# Patient Record
Sex: Male | Born: 1955 | Race: White | Hispanic: No | Marital: Married
Health system: Midwestern US, Community
[De-identification: ages and names within clinical notes are randomized; demographics above are authoritative.]

## PROBLEM LIST (undated history)

## (undated) DIAGNOSIS — G35 Multiple sclerosis: Principal | ICD-10-CM

## (undated) DIAGNOSIS — R350 Frequency of micturition: Secondary | ICD-10-CM

## (undated) DIAGNOSIS — I10 Essential (primary) hypertension: Secondary | ICD-10-CM

## (undated) DIAGNOSIS — N402 Nodular prostate without lower urinary tract symptoms: Secondary | ICD-10-CM

---

## 2009-04-21 LAB — LIPID PANEL
Cholesterol, Total: 170
HDL: 46 mg/dl
LDL Calculated: 114 mg/dL (ref 0–160)
Triglycerides: 50 mg/dL

## 2009-04-21 LAB — PSA SCREENING: PSA: 0.48 ng/mL

## 2010-06-27 NOTE — Progress Notes (Signed)
Subjective:      Patient ID: Kenneth Wong is a 55 y.o. male.    HPI    Pt stable since last visit- pt has had 2 hernia surgeries per Dr. Neoma Laming.   Pt complains of dyspnea with exertion- no other associated symptoms- no chest pain, arm pain, neck pain, jaw, palpitations, diaphoresis.      Review of Systems   Constitutional: Negative for fever, chills, diaphoresis, fatigue and unexpected weight change.   HENT: Negative for neck pain.    Eyes: Negative for visual disturbance.   Respiratory: Positive for shortness of breath (with exertion). Negative for apnea, cough, choking, chest tightness, wheezing and stridor.    Cardiovascular: Negative for chest pain, palpitations and leg swelling.   Gastrointestinal: Negative for nausea, vomiting, abdominal pain, diarrhea, constipation, blood in stool and anal bleeding.   Genitourinary: Negative for dysuria and hematuria.   Neurological: Negative for dizziness and headaches.         BP 116/72   Pulse 60   Temp(Src) 98.1 ??F (36.7 ??C) (Oral)   Resp 14   Ht 5\' 11"  (1.803 m)   Wt 189 lb (85.73 kg)   BMI 26.36 kg/m2    Objective:   Physical Exam   Nursing note and vitals reviewed.  Constitutional: He is oriented to person, place, and time. He appears well-developed and well-nourished.   HENT:   Head: Normocephalic and atraumatic.   Right Ear: External ear normal.   Left Ear: External ear normal.   Nose: Nose normal.   Mouth/Throat: Oropharynx is clear and moist.   Eyes: Conjunctivae and EOM are normal. Pupils are equal, round, and reactive to light.   Neck: Normal range of motion. Neck supple.   Cardiovascular: Normal rate, regular rhythm and intact distal pulses.    Pulmonary/Chest: Effort normal and breath sounds normal.   Abdominal: Soft. Bowel sounds are normal. Hernia confirmed negative in the right inguinal area and confirmed negative in the left inguinal area.   Genitourinary: Rectum normal, testes normal and penis normal. Rectal exam shows no external hemorrhoid, no internal  hemorrhoid, no fissure, no mass, no tenderness and anal tone normal. Guaiac negative stool. Prostate is not enlarged and not tender. Right testis shows no mass, no swelling and no tenderness. Right testis is descended. Left testis shows no mass, no swelling and no tenderness. Left testis is descended. Circumcised. No phimosis, paraphimosis, hypospadias, penile erythema or penile tenderness. No discharge found.        Small nodule palpated at superior left portion of prostate, also prominence noted at right inferior border.  ES   Musculoskeletal: Normal range of motion.   Neurological: He is alert and oriented to person, place, and time. He has normal reflexes.   Skin: Skin is warm and dry.   Psychiatric: He has a normal mood and affect. His behavior is normal. Judgment and thought content normal.                 Immunization History   Administered Date(s) Administered   ??? Influenza Virus Vaccine 11/20/2007, 12/20/2009   ??? Td 02/20/2003     Health Maintenance   Topic Date Due   ??? Colon Cancer Screening Colonoscopy  03/18/2004   ??? Psa Counseling  04/22/2010   ??? Flu Vaccine Yearly (Adult)  10/21/2010   ??? Tetanus Vaccine Adult (11 Years And Up)  02/19/2013       Assessment:      1. Well adult exam  2. MS (multiple sclerosis)     3. Prostate nodule  Ambulatory referral to Urology   4. Special screening for malignant neoplasm of prostate  Psa screening   5. Screening, lipid  Lipid panel   6. Healthcare maintenance  Basic Metabolic Panel             Plan:      Encouraged pt to check with insurance re Pneumonia and chronic illness (MS).   Encouraged pt to discuss with Dr. Otilio Miu the Shingles shot and ability to get.    Will track down copy of most recent colonoscopy.    Check PSA, BMP, FLP with LFT's per Dr. Otilio Miu.    After discussion with pt re Shortness of Breath with exertion and no associated cardiac symptoms at this time, he would like to hold on any further testing at this point. If he has any cardiac symptoms,  will proceed to ED for further evaluation.    Refer to Dr. Harle Stanford  Follow up in 12 months.

## 2010-06-27 NOTE — Patient Instructions (Addendum)
Thank you for enrolling in MyChart. Please follow the instructions below to securely access your online medical record. MyChart allows you to send messages to your doctor, view your test results, renew your prescriptions, schedule appointments, and more.     How Do I Sign Up?  1. In your Internet browser, go to [email or web address]  2. Click on the Sign Up Now link in the Sign In box. You will see the New Member Sign Up page.  3. Enter your MyChart Access Code exactly as it appears below. You will not need to use this code after you???ve completed the sign-up process. If you do not sign up before the expiration date, you must request a new code.  MyChart Access Code: [email or web address]  Expires: 08/26/2010 11:23 AM    4. Enter your Social Security Number ([id number]) and Date of Birth (mm/dd/yyyy) as indicated and click Submit. You will be taken to the next sign-up page.  5. Create a MyChart ID. This will be your MyChart login ID and cannot be changed, so think of one that is secure and easy to remember.  6. Create a MyChart password. You can change your password at any time.  7. Enter your Password Reset Question and Answer. This can be used at a later time if you forget your password.   8. Enter your e-mail address. You will receive e-mail notification when new information is available in MyChart.  9. Click Sign Up. You can now view your medical record.     Additional Information  If you have questions, please contact your physician practice where you receive care. Remember, MyChart is NOT to be used for urgent needs. For medical emergencies, dial 911.            Encouraged pt to check with insurance re Pneumonia and chronic illness (MS).   Encouraged pt to discuss with Dr. Otilio Miu the Shingles shot and ability to get.    Will track down copy of most recent colonoscopy.    Check PSA, BMP, FLP with LFT's per Dr. Otilio Miu.    After discussion with pt re Shortness of Breath with exertion and no  associated cardiac symptoms at this time, he would like to hold on any further testing at this point. If he has any cardiac symptoms, will proceed to ED for further evaluation.    Refer to Dr. Harle Stanford  Follow up in 12 months.

## 2010-06-27 NOTE — Progress Notes (Signed)
Appt scheduled with Dr. Harle Stanford on 08/08/10 at 1:45 pm. Pt notified.

## 2010-08-08 LAB — POCT URINE DIPSTICK
Leukocytes, UA: NEGATIVE
Nitrite, UA: NEGATIVE

## 2010-08-08 MED ORDER — CIPROFLOXACIN HCL 500 MG PO TABS
500 MG | ORAL_TABLET | Freq: Two times a day (BID) | ORAL | Status: AC
Start: 2010-08-08 — End: 2010-08-11

## 2010-08-08 NOTE — Progress Notes (Signed)
Kenneth Wong was seen today for evaluation of a prostate nodule.  He is regularly seen by Kenneth Wong who performed a digital rectal exam as part of his evaluation.  He found a nodule on the prostate and felt, particularly with Kenneth Wong's history of prostate cancer in his father, that the nodule should be evaluated.  He is here today to discuss biopsy and make further follow up plans.        Past Medical History   Diagnosis Date   ??? Allergic rhinitis    ??? MS (multiple sclerosis) 2007     Kenneth Wong/ +LP, +MRI   ??? Skin cancer        Past Surgical History   Procedure Date   ??? Tonsillectomy and adenoidectomy age 59   ??? Appendectomy age 75   ??? Vasectomy    ??? Hemorrhoid surgery    ??? Hernia repair      x 2   ??? Skin cancer excision 2010     melanoma       Current Outpatient Prescriptions on File Prior to Visit   Medication Sig Dispense Refill   ??? gabapentin (NEURONTIN) 400 MG capsule Take 400 mg by mouth 2 times daily.         ??? Interferon Beta-1a (AVONEX IM) Inject  into the muscle once a week.         ??? Cholecalciferol (VITAMIN D PO) Take  by mouth daily.         ??? B Complex Vitamins (VITAMIN B COMPLEX PO) Take  by mouth daily.         ??? Loratadine (CLARITIN PO) Take  by mouth daily.         ??? aspirin 81 MG tablet Take 81 mg by mouth daily.         ??? Ascorbic Acid (VITAMIN C PO) Take  by mouth daily.         ??? Acetaminophen (TYLENOL PO) Take  by mouth as needed.         ??? Ibuprofen (ADVIL PO) Take  by mouth as needed.         ??? Naproxen Sodium (ALEVE PO) Take  by mouth as needed.             No Known Allergies    History   Substance Use Topics   ??? Smoking status: Former Smoker     Quit date: 06/26/2000   ??? Smokeless tobacco: Not on file    Comment: 1 1/2 packs a day x 30 years-quit 8 years ago   ??? Alcohol Use: Yes      6 pack/week       Family History   Problem Relation Age of Onset   ??? Parkinsonism Mother    ??? COPD Father    ??? Prostate Cancer Father    ??? Prostate Cancer Maternal Uncle        Review of Systems  No problems  with ears, nose or throat.  No problems with eyes.  No chest pain, shortness of breath, abdominal pain, extremity pain or weakness, and no neurological deficits.  No rashes.  No swollen glands or lymph nodes.  GU symptoms per HPI.  The remainder of the review of symptoms is negative.    Physical Exam  Nursing note and vitals reviewed.  Constitutional: Vital signs are normal. he appears well-developed and well-nourished. he is cooperative.   HENT:   Head: Normocephalic and atraumatic.   Mouth/Throat: Mucous membranes are  normal.   Eyes: EOM are normal. No scleral icterus.   Neck: Trachea normal. Neck supple. No tracheal deviation present.   Cardiovascular: Normal rate, regular rhythm, normal heart sounds and normal pulses.    Pulses:       Radial pulses are 2+ on the right side, and 2+ on the left side.   Pulmonary/Chest: Effort normal and breath sounds normal. No respiratory distress.   Abdominal: Soft. No tenderness. he has no rebound, no guarding and no CVA tenderness.   Musculoskeletal: Normal range of motion. he exhibits no edema and no tenderness.  Lymphadenopathy:     he has no cervical adenopathy.        Right: No supraclavicular adenopathy present.        Left: No supraclavicular adenopathy present.   Neurological: he is alert. No cranial nerve deficit.   Skin: Skin is warm and dry.   Psychiatric: he has a normal mood and affect.     Labs  Urine dip demonstrates trace blood.    Assessment and Plan  Kenneth Wong presents for evaluation of a prostate nodule.  We discussed DRE and its roll in the screening of prostate cancer, along with its limitations for the same.  We discussed prostate cancer and briefly touched on possible treatment options, including active surveillance, should a cancer be found.      We discussed prostate biopsy.  I described the procedure in detail and also described the associated risks and benefits at length.  Kenneth Wong understands these risks and benefits and desires to proceed.  He will be  scheduled for the procedure in the very near future.     I will see Kenneth Wong back in the office in the next couple weeks for his biopsy. He will stop all asprin and blood thinners and will start a 3 day course of Cipro the day before the procedure.  I will see him back a week or two after the procedure to discuss the results.  We will make further recommendations based on these results.

## 2010-08-09 NOTE — Patient Instructions (Signed)
Benign Prostatic Hyperplasia   (BPH; Benign Prostatic Hypertrophy; Prostatism; Bladder Outlet Obstruction)     Definition   Benign prostatic hyperplasia (BPH) is an enlargement of the prostate. The prostate is usually a walnut-sized gland located at the neck of the bladder. It surrounds the urethra. The gland is part of the male reproductive system. The enlargement is not due to cancer.     Enlarged Prostate        2011 [employer]   Causes   The exact cause of BPH is unknown. It may be related to changes in hormone levels as men age. Eventually, the prostate becomes so enlarged that it puts pressure on the urethra. This causes the urethra to narrow or close completely.   Risk Factors   A risk factor is something that increases your chance of getting a disease or condition. The main risk factor for BPH is being over 50 years old.   Symptoms   Narrowing of the urethra caused by growth of the prostate causes the symptoms of BPH. Symptoms usually increase in severity over time.   Symptoms include:   ?? Difficulty starting to urinate   ?? Weak urination stream   ?? Dribbling at end of urination   ?? Sensation of incomplete bladder emptying   ?? Urge to urinate frequently, especially at night   ?? Deep discomfort in lower abdomen   ?? Urge incontinence   Diagnosis   BPH diagnosis is based on:   ?? Your age   ?? Symptoms   ?? Digital rectal examthe doctor inserts a gloved finger into the rectum to examine the area   Other tests may include:   ?? Urine flow study   ?? Cystometrogram (a functional study of the way your bladder fills and empties)   ?? X-ray of the urinary tract   ?? Transrectal ultrasound   ?? Post-void residual volume testmeasures whether you can empty your bladder completely   ?? Cystoscopy this test allows a doctor to look inside the urethra and bladder   Testing for prostate specific antigen (PSA) is often used to screen for prostate cancer. However, BPH may cause a lesser elevation in PSA levels . This  can raise false concerns about the presence of cancer.   Treatment   Treatment is not needed for mild cases. Most men with BPH eventually request medical intervention.   Treatments include:   Medication   Medicines include:   ?? Enzyme ( 5-alpha reductase) inhibitors   ?? Finasteride   ?? Dutasteride (Avodart)   ?? Alpha-blockers (eg, tamsulosin , alfuzosin , doxazosin , terazosin ))reduce bladder obstruction and improve urine flow by relaxing the muscles of the prostate and bladder neck   ?? Antimuscarinics ( oxybutynin , solifenacin , tolterodine , darifenacin , trospium , fesoterodine )relax the bladder muscles   Each group of medicines have different side effects. Enzyme inhibitors may cause decreased sexual desire and problems with erection. Alpha-blockers may cause decreased blood pressure, dizziness, and stuffy nose. Antimuscarinics can cause dry mouth, constipation, dry eyes, trouble emptying the bladder, and confusion. Doctors may prescribe a combination of drugs from each group, which may work better to prevent worsening of disease.   Men with BPH should not take decongestant drugs containing alpha-agonists, such as pseudoephedrine . These drugs can worsen the symptoms of BPH.   Minimally Invasive Interventions   These are used when drugs are ineffective, but the patient is not ready for surgery. Nonsurgical treatments include:   ??   Transurethral microwave thermotherapy (TUMT)uses microwaves to destroy excess prostate tissue   ?? Transurethral needle ablation (TUNA)uses low levels of radio frequency energy to burn away portions of the enlarged prostate   ?? Transurethral laser therapyuses highly focused laser energy to remove prostate tissue   Surgery   Surgical procedures include:   ?? Transurethral surgical resection of the prostate (TURP) a scope is inserted through the penis to remove the enlarged portion of the prostate   ?? Transurethral incision of the prostate (TUIP)small cuts are made in the neck of the  bladder to widen the urethra   ?? Open surgeryremoval of the enlarged portion of the prostate through an incision, usually in the lower abdominal area, much more invasive then TURP or TUIP   ?? Prostatic stentstiny metal coils that are inserted into urethra to widen it and keep it open   ?? Usually used for men who do not wish to take medication or have surgery   ?? Do not appear to be a good long-term option   Alternative Treatments   Three different herbal products are used for BPH:   ?? Saw palmetto   ?? Beta-sitosterol   ?? Pygeum ( Prunus Africana )   Research regarding its effectiveness and safety is limited. The results are often conflicting. This may be due to variability of the products and difficulty in conducting these types of studies.   If you are diagnosed with BPH, follow your doctor's instructions .   Prevention   Prostate enlargement occurs naturally with age. There are no specific prevention steps.     Last Reviewed: September 2010 Adrienne Carmack, MD   Updated: 11/08/2008

## 2010-08-12 LAB — LIPID PANEL
Cholesterol, Total: 150
HDL: 53 mg/dL (ref 35–70)
LDL Calculated: 89 mg/dL (ref 0–160)
Triglycerides: 39 mg/dL

## 2010-08-16 NOTE — Telephone Encounter (Signed)
Spoke to pt and notified him of his lab results.

## 2010-08-16 NOTE — Telephone Encounter (Signed)
See scanned copy of labs.  Per ES - call patient BMP/FLP ok.  Left message to call back.

## 2010-08-24 NOTE — Progress Notes (Signed)
Mr. Brandstetter was seen in follow up for his prostate biopsy.  This was done without difficulty or complication.    TRUS prostate biopsy note:  After obtaining informed consent, the rectal ultrasound probe was passed per rectum and the prostate visualized.  A local block was performed by instilling 2% lidocaine at the base Measurements were taken and the prostate measured 3.48 x 4.44 x 4.65 mm for a total volume of 37.6 cc.  At this point, prostate biopsy was performed.  Two cores were taken at the base, the mid-portion, and the apex of the gland on either side for a total of 12 cores.  The rectal probe was removed and there was minimal bleeding.  Mr. Muff tolerated the procedure well and there were no complications.    Prostate size: 37.6 cc  Prostatic calcifications:no   Hypoechoic areas: no  Cores taken:12  Complications: none    Plan:  I will see Caeson back to discuss the pathology in 1-2 weeks.  Further recommendations will be based on these results.

## 2010-08-24 NOTE — Progress Notes (Signed)
Procedure: Trus/Biopsy  Pt name and birth date verified Yes  Patient agrees Dr. Is taking biopsies of the prostate Yes  Patient took Enema 2 hours prior to procedure Yes  Patient took 2 pills of Cipro day before procedure, day of, and will the day after Yes  Patient stopped all blood thinners prior to surgery Yes

## 2010-09-06 NOTE — Progress Notes (Signed)
Subjective:      Patient ID: Kenneth Wong is a 55 y.o. male.  Chief Complaint   Patient presents with   ??? Follow-up     bx results     Prostate nodule:    Other  This is a chronic problem. The current episode started more than 1 month ago. The problem occurs constantly. The problem has been unchanged. Associated symptoms include joint swelling and a rash. Pertinent negatives include no chest pain, congestion, nausea, sore throat or vomiting. The symptoms are aggravated by nothing. He has tried nothing for the symptoms. Improvement on treatment: biopsy performed, small amount of HG PIN.     Underwent biopsy.  Here to discuss results.    Past Medical History   Diagnosis Date   ??? Allergic rhinitis    ??? MS (multiple sclerosis) 2007     Dr. Almudallal/ +LP, +MRI   ??? Skin cancer        History     Social History   ??? Marital Status: Married     Spouse Name: Arline Asp     Number of Children: 2   ??? Years of Education: N/A     Occupational History   ???  [employer]     Social History Main Topics   ??? Smoking status: Former Smoker     Quit date: 06/26/2000   ??? Smokeless tobacco: Not on file    Comment: 1 1/2 packs a day x 30 years-quit 8 years ago   ??? Alcohol Use: Yes      6 pack/week   ??? Drug Use: No   ??? Sexually Active: Not on file     Other Topics Concern   ??? Not on file     Social History Narrative   ??? No narrative on file       Family History   Problem Relation Age of Onset   ??? Parkinsonism Mother    ??? COPD Father    ??? Prostate Cancer Father    ??? Prostate Cancer Maternal Uncle        Past Surgical History   Procedure Date   ??? Tonsillectomy and adenoidectomy age 78   ??? Appendectomy age 81   ??? Vasectomy    ??? Hemorrhoid surgery    ??? Hernia repair      x 2   ??? Skin cancer excision 2010     melanoma   ??? Prostate biopsy 08/2010     dr Harle Stanford       No Known Allergies    Current outpatient prescriptions:gabapentin (NEURONTIN) 400 MG capsule, Take 400 mg by mouth 2 times daily.  , Disp: , Rfl: ;  Interferon Beta-1a (AVONEX IM),  Inject  into the muscle once a week.  , Disp: , Rfl: ;  Cholecalciferol (VITAMIN D PO), Take  by mouth daily.  , Disp: , Rfl: ;  B Complex Vitamins (VITAMIN B COMPLEX PO), Take  by mouth daily.  , Disp: , Rfl: ;  Loratadine (CLARITIN PO), Take  by mouth daily.  , Disp: , Rfl:   aspirin 81 MG tablet, Take 81 mg by mouth daily.  , Disp: , Rfl: ;  Ascorbic Acid (VITAMIN C PO), Take  by mouth daily.  , Disp: , Rfl: ;  Acetaminophen (TYLENOL PO), Take  by mouth as needed.  , Disp: , Rfl: ;  Ibuprofen (ADVIL PO), Take  by mouth as needed.  , Disp: , Rfl: ;  Naproxen Sodium (  ALEVE PO), Take  by mouth as needed.  , Disp: , Rfl:         Review of Systems   Constitutional: Negative for activity change and appetite change.   HENT: Negative for congestion, sore throat, facial swelling and sneezing.    Eyes: Negative for pain and itching.   Respiratory: Negative for apnea, chest tightness and shortness of breath.    Cardiovascular: Negative for chest pain and leg swelling.   Gastrointestinal: Negative for nausea, vomiting, diarrhea, constipation and rectal pain.   Genitourinary: Negative for dysuria, urgency, frequency, hematuria, flank pain, decreased urine volume, discharge, penile swelling, scrotal swelling, enuresis, difficulty urinating, genital sores, penile pain and testicular pain.   Musculoskeletal: Positive for joint swelling. Negative for back pain.   Skin: Positive for rash. Negative for wound.   Neurological: Negative for dizziness and light-headedness.   Hematological: Negative for adenopathy. Does not bruise/bleed easily.   Psychiatric/Behavioral: Negative for hallucinations and confusion.     BP 124/76   Ht 5' 10.5" (1.791 m)   Wt 185 lb (83.915 kg)   BMI 26.17 kg/m2    Objective:   Physical Exam   Constitutional: He is oriented to person, place, and time. He appears well-developed and well-nourished.   HENT:   Head: Normocephalic and atraumatic.   Eyes: EOM are normal.   Pulmonary/Chest: Effort normal and breath  sounds normal. No respiratory distress.   Abdominal: Soft. He exhibits no distension. There is no tenderness.   Neurological: He is alert and oriented to person, place, and time.   Skin: Skin is warm and dry.   Psychiatric: He has a normal mood and affect.     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  Otherwise all cores were negative.  PSA: 0.48       Assessment:      Negative biopsy.  Small amount of high grade PIN.      Plan:      Will check PSA in 6 months.  If remains very low and stable will continue to watch.  If PSA rising will plan to check PCA-3 test.

## 2010-10-02 NOTE — Patient Instructions (Signed)
1.  Continue avonex 30 mcg IM weekly. With calcium and vitamin D daily.   2. Need to do CBC, hepatic panel.    3. Follow up in 6 months or sooner if needed.   4. Call if any questions or concerns.    Ma Hillock. Giulian Goldring MD

## 2010-10-02 NOTE — Progress Notes (Signed)
NEUROLOGY OUT PATIENT FOLLOW UP NOTE:  8/13/20122:32 PM    Derek Mound is here for follow up for   Patient Active Problem List   Diagnoses   . Allergic rhinitis   . MS (multiple sclerosis)   . Prostate nodule    Doing well no new events. On avonex, calcium and vitamin D daily, no new complaints. Tolerating avonex well. No numbness, no balance trouble, no weakness. Overall happy with control of symptoms.     ROS:  Respiratory : no cough, no hemoptysis.  Cardiac: no chest pain. No palpitations.  Renal : no flank pain, no hematuria   Reviewed labs since last evaluation and discussed with patient.     Prior to Admission medications    Medication Sig Start Date End Date Taking? Authorizing Provider   gabapentin (NEURONTIN) 400 MG capsule Take 400 mg by mouth 2 times daily.     Yes Historical Provider, MD   Interferon Beta-1a (AVONEX IM) Inject  into the muscle once a week.     Yes Historical Provider, MD   Cholecalciferol (VITAMIN D PO) Take  by mouth daily.     Yes Historical Provider, MD   B Complex Vitamins (VITAMIN B COMPLEX PO) Take  by mouth daily.     Yes Historical Provider, MD   Loratadine (CLARITIN PO) Take  by mouth daily.     Yes Historical Provider, MD   aspirin 81 MG tablet Take 81 mg by mouth daily.     Yes Historical Provider, MD   Ascorbic Acid (VITAMIN C PO) Take  by mouth daily.     Yes Historical Provider, MD   Acetaminophen (TYLENOL PO) Take  by mouth as needed.     Yes Historical Provider, MD   Ibuprofen (ADVIL PO) Take  by mouth as needed.     Yes Historical Provider, MD   Naproxen Sodium (ALEVE PO) Take  by mouth as needed.     Yes Historical Provider, MD     No Known Allergies  Current outpatient prescriptions:gabapentin (NEURONTIN) 400 MG capsule, Take 400 mg by mouth 2 times daily.  , Disp: , Rfl: ;  Interferon Beta-1a (AVONEX IM), Inject  into the muscle once a week.  , Disp: , Rfl: ;  Cholecalciferol (VITAMIN D PO), Take  by mouth daily.  , Disp: , Rfl: ;  B Complex Vitamins (VITAMIN B  COMPLEX PO), Take  by mouth daily.  , Disp: , Rfl: ;  Loratadine (CLARITIN PO), Take  by mouth daily.  , Disp: , Rfl:   aspirin 81 MG tablet, Take 81 mg by mouth daily.  , Disp: , Rfl: ;  Ascorbic Acid (VITAMIN C PO), Take  by mouth daily.  , Disp: , Rfl: ;  Acetaminophen (TYLENOL PO), Take  by mouth as needed.  , Disp: , Rfl: ;  Ibuprofen (ADVIL PO), Take  by mouth as needed.  , Disp: , Rfl: ;  Naproxen Sodium (ALEVE PO), Take  by mouth as needed.  , Disp: , Rfl:       PE:   Filed Vitals:    10/02/10 1412   BP: 136/81   Pulse: 67   Height: 5' 10.5" (1.791 m)   Weight: 185 lb (83.915 kg)     General Appearance:  awake, alert, oriented, in no acute distress, well developed, well nourished, in no acute distress and cooperative  Gen: AO3, NAD, Language is Intact.  Head: PERRL, EOMI, no icterus  Neck: There is  no carotid bruits. The Neck is supple.  Neuro: CN 2-12 grossly intact with no focal deficits. Power 5/5 Throughout symmetric, Reflexes are +2 symmetric. Long tracts are intact. Cerebellar exam is Intact. Sensory exam is intact to light touch.  Gait is intact.  Osteo: Lumbar curvatures with no scoliosis and good ROM        DATA:    Did not do blood work, forgot to do.     Assessment:    1. Multiple sclerosis, relapsing-remitting    stable, doing well, no new events. On avonex weekly.     Plan:  1.  Continue avonex 30 mcg IM weekly.  2. Need to do CBC, hepatic panel.    3. Follow up in 6 months or sooner if needed.   4. Patient is instructed to call if any questions or concerns.      Please call if any questions.     Ma Hillock. Cordero Surette MD

## 2011-01-03 MED ORDER — INTERFERON BETA-1A 30 MCG/0.5ML IM KIT
30 MCG/0.5ML | PACK | INTRAMUSCULAR | Status: DC
Start: 2011-01-03 — End: 2011-10-01

## 2011-01-03 MED ORDER — GABAPENTIN 400 MG PO CAPS
400 MG | ORAL_CAPSULE | Freq: Two times a day (BID) | ORAL | Status: DC
Start: 2011-01-03 — End: 2011-10-01

## 2011-04-02 NOTE — Progress Notes (Signed)
NEUROLOGY OUT PATIENT FOLLOW UP NOTE:  2/11/20134:14 PM    Kenneth Wong is here for follow up for   Patient Active Problem List   Diagnosis   . Allergic rhinitis   . MS (multiple sclerosis)   . Prostate nodule    Kenneth Wong  is here for follow up of multiple sclerosis relapsing. Comes in by himself. He reports no new events. he is on Avonex 30 mcg IM weekly. No new reported vision change. No new balance issues. No urinary or bowel symptoms. No numbness. The patient is also on vitamin D, calcium, primrose oil. Blood work last done was 6 months ago.    ROS:  Respiratory : no cough, no hemoptysis.  Cardiac: no chest pain. No palpitations.  Renal : no flank pain, no hematuria    Reviewed labs since last evaluation and discussed with patient.     Prior to Admission medications    Medication Sig Start Date End Date Taking? Authorizing Provider   interferon beta-1a (AVONEX) 30 MCG/0.5ML injection Inject 0.5 mLs into the muscle once a week. 01/03/11  Yes Cain Saupe, MD   gabapentin (NEURONTIN) 400 MG capsule Take 1 capsule by mouth 2 times daily. 01/03/11  Yes Cain Saupe, MD   Cholecalciferol (VITAMIN D PO) Take  by mouth daily.     Yes Historical Provider, MD   B Complex Vitamins (VITAMIN B COMPLEX PO) Take  by mouth daily.     Yes Historical Provider, MD   Loratadine (CLARITIN PO) Take  by mouth daily.     Yes Historical Provider, MD   aspirin 81 MG tablet Take 81 mg by mouth daily.     Yes Historical Provider, MD   Ascorbic Acid (VITAMIN C PO) Take  by mouth daily.     Yes Historical Provider, MD   Naproxen Sodium (ALEVE PO) Take  by mouth as needed.     Yes Historical Provider, MD     No Known Allergies  Current outpatient prescriptions:interferon beta-1a (AVONEX) 30 MCG/0.5ML injection, Inject 0.5 mLs into the muscle once a week., Disp: 12 kit, Rfl: 3;  gabapentin (NEURONTIN) 400 MG capsule, Take 1 capsule by mouth 2 times daily., Disp: 180 capsule, Rfl: 3;  Cholecalciferol (VITAMIN D PO), Take  by  mouth daily.  , Disp: , Rfl: ;  B Complex Vitamins (VITAMIN B COMPLEX PO), Take  by mouth daily.  , Disp: , Rfl:   Loratadine (CLARITIN PO), Take  by mouth daily.  , Disp: , Rfl: ;  aspirin 81 MG tablet, Take 81 mg by mouth daily.  , Disp: , Rfl: ;  Ascorbic Acid (VITAMIN C PO), Take  by mouth daily.  , Disp: , Rfl: ;  Naproxen Sodium (ALEVE PO), Take  by mouth as needed.  , Disp: , Rfl:       PE:   Filed Vitals:    04/02/11 1602   BP: 137/86   Pulse: 77   Height: 5\' 10"  (1.778 m)   Weight: 190 lb (86.183 kg)     General Appearance:  awake, alert, oriented, in no acute distress, well developed, well nourished and cooperative  Gen: AO3, NAD, Language is Intact.  Head: PERRL, EOMI, no icterus  Neck: There is no carotid bruits. The Neck is supple.  Neuro: CN 2-12 grossly intact with no focal deficits. Power 5/5 Throughout symmetric, Reflexes are symmetric. Long tracts are intact. Cerebellar exam is Intact. Sensory exam is intact to light touch.  Gait is intact.  Osteo: Lumbar curvatures with no scoliosis and good ROM        DATA:  Reviewed.     Assessment:    1. Multiple sclerosis, relapsing-remitting     He is doing, no reported events. After detailed discussion with patient we agreed on the following plan.      Plan:  1.  Continue current medication.   2.  Follow up in 6 months or sooner if needed.   3. CBC, hepatic panel.   4. Continue calcium and vitamin D daily with primrose oil.   5. Call if any questions or concerns.      Please call if any questions.     Ma Hillock. Serai Tukes MD

## 2011-04-02 NOTE — Patient Instructions (Signed)
1.  Continue current medications.   2.  Follow up in 6 months or sooner if needed.   3. CBC, hepatic panel.   4. Continue calcium and vitamin D daily with primrose oil.   5. Call if any questions or concerns.

## 2011-04-17 LAB — POCT URINE DIPSTICK
Blood, UA POC: NEGATIVE
Leukocytes, UA: NEGATIVE
Nitrite, UA: NEGATIVE

## 2011-04-17 LAB — POST VOID RESIDUAL (PVR): post void residual: 113 ml

## 2011-04-17 NOTE — Progress Notes (Signed)
Subjective:      Patient ID: Kenneth Wong is a 56 y.o. male.    Chief Complaint   Patient presents with   ??? Elevated PSA     f/u 6 mo       Other  This is a chronic (prostate nodule) problem. The current episode started more than 1 month ago. The problem occurs constantly. The problem has been unchanged. Pertinent negatives include no abdominal pain, chest pain, chills, fever, joint swelling, nausea, rash or vomiting. Nothing aggravates the symptoms. 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 The treatment provided moderate relief.       Past Medical History   Diagnosis Date   ??? Allergic rhinitis    ??? MS (multiple sclerosis) 2007     Dr. Almudallal/ +LP, +MRI   ??? Skin cancer        History     Social History   ??? Marital Status: Married     Spouse Name: Arline Asp     Number of Children: 2   ??? Years of Education: N/A     Occupational History   ???  [employer]     Social History Main Topics   ??? Smoking status: Former Smoker     Quit date: 06/26/2000   ??? Smokeless tobacco: Not on file    Comment: 1 1/2 packs a day x 30 years-quit 8 years ago   ??? Alcohol Use: Yes      6 pack/week   ??? Drug Use: No   ??? Sexually Active: Not on file     Other Topics Concern   ??? Not on file     Social History Narrative   ??? No narrative on file       Family History   Problem Relation Age of Onset   ??? Parkinsonism Mother    ??? COPD Father    ??? Prostate Cancer Father    ??? Prostate Cancer Maternal Uncle        Past Surgical History   Procedure Laterality Date   ??? Tonsillectomy and adenoidectomy  age 67   ??? Appendectomy  age 30   ??? Vasectomy     ??? Hemorrhoid surgery     ??? Hernia repair       x 2   ??? Skin cancer excision  2010     melanoma   ??? Prostate biopsy  08/2010     dr Harle Stanford       No Known Allergies    Current outpatient prescriptions:interferon beta-1a (AVONEX) 30 MCG/0.5ML injection, Inject 0.5 mLs into the muscle once a week., Disp: 12 kit, Rfl: 3;  gabapentin (NEURONTIN) 400 MG capsule, Take 1 capsule  by mouth 2 times daily., Disp: 180 capsule, Rfl: 3;  Cholecalciferol (VITAMIN D PO), Take  by mouth daily.  , Disp: , Rfl: ;  B Complex Vitamins (VITAMIN B COMPLEX PO), Take  by mouth daily.  , Disp: , Rfl:   Loratadine (CLARITIN PO), Take  by mouth daily.  , Disp: , Rfl: ;  aspirin 81 MG tablet, Take 81 mg by mouth daily.  , Disp: , Rfl: ;  Ascorbic Acid (VITAMIN C PO), Take  by mouth daily.  , Disp: , Rfl: ;  Naproxen Sodium (ALEVE PO), Take  by mouth as needed.  , Disp: , Rfl:     Review of Systems   Constitutional: Negative for fever, chills and unexpected weight change.   Eyes: Negative  for pain and visual disturbance.   Respiratory: Negative for shortness of breath and wheezing.    Cardiovascular: Negative for chest pain and palpitations.   Gastrointestinal: Negative for nausea, vomiting and abdominal pain.   Endocrine: Negative for cold intolerance and heat intolerance.   Genitourinary: Positive for frequency. Negative for dysuria, urgency, hematuria and difficulty urinating.   Musculoskeletal: Negative for back pain and joint swelling.   Skin: Negative for rash and wound.   Allergic/Immunologic: Negative for environmental allergies and food allergies.   Neurological: Negative for dizziness, tremors and seizures.   Hematological: Does not bruise/bleed easily.   Psychiatric/Behavioral: Negative for confusion and disturbed wake/sleep cycle.   BP 128/66   Ht 5' 11.5" (1.816 m)   Wt 201 lb 4.8 oz (91.309 kg)   BMI 27.69 kg/m2    Objective:   Physical Exam   Constitutional: He is oriented to person, place, and time. He appears well-developed and well-nourished. No distress.   HENT:   Head: Normocephalic and atraumatic.   Mouth/Throat: No oropharyngeal exudate.   Eyes: EOM are normal. Pupils are equal, round, and reactive to light. Right eye exhibits no discharge. Left eye exhibits no discharge. No scleral icterus.   Neck: No JVD present. No tracheal deviation present.   Cardiovascular: Normal rate and normal heart  sounds.    Pulmonary/Chest: Effort normal. No respiratory distress. He has no wheezes.   Abdominal: Soft. He exhibits no distension. There is no tenderness.   Musculoskeletal: He exhibits no edema and no tenderness.   Neurological: He is alert and oriented to person, place, and time.   Skin: Skin is warm and dry. He is not diaphoretic.   Psychiatric: He has a normal mood and affect. His behavior is normal.     Labs    Results for POC orders placed in visit on 04/17/11   POST VOID RESIDUAL (PVR)       Result Value Range    post void residual 113     POCT URINE DIPSTICK       Result Value Range    Color, UA        Clarity, UA        Glucose, UA POC        Bilirubin, UA        Ketones, UA        Spec Grav, UA        Blood, UA POC neg      pH, UA        Protein, UA POC        Urobilinogen, UA        Leukocytes, UA neg      Nitrite, UA neg      Appearance, Fluid    Clear, Slightly Cloudy     PSA: 0.7    Assessment:      PSA remains under 1.  Having some moderate LUTS and today has a PVR of 113.  This will need to be watched along with the PSA.         Plan:      Will see back in 6 months with PSA.  If PSA climbs above 1 we will plan to perform a DRE at next visit and send a PCA-3 test.  We will also check a flow rate and a PVR at that visit.    Follow up in 6 months.

## 2011-09-25 NOTE — Telephone Encounter (Signed)
Left message.

## 2011-09-25 NOTE — Telephone Encounter (Signed)
Message copied by Carlena Bjornstad on Tue Sep 25, 2011 10:29 AM  ------       Message from: Valentina Shaggy, ALI S       Created: Tue Sep 25, 2011  9:42 AM         Please let patient Know Test/Lab result is normal.  ------

## 2011-10-01 MED ORDER — GABAPENTIN 400 MG PO CAPS
400 MG | ORAL_CAPSULE | Freq: Two times a day (BID) | ORAL | Status: DC
Start: 2011-10-01 — End: 2012-01-11

## 2011-10-01 MED ORDER — INTERFERON BETA-1A 30 MCG/0.5ML IM KIT
30 MCG/0.5ML | PACK | INTRAMUSCULAR | Status: DC
Start: 2011-10-01 — End: 2012-01-11

## 2011-10-01 NOTE — Patient Instructions (Signed)
1.  Continue current medication.   2.  Follow up in 6 months or sooner if needed.   3. CBC, hepatic panel prior to next visit.   4. Continue calcium and vitamin D daily with primrose oil.   5. Call if any questions or concerns.

## 2011-10-01 NOTE — Progress Notes (Signed)
NEUROLOGY OUT PATIENT FOLLOW UP NOTE:  8/12/20134:29 PM    Kenneth Wong is here for follow up for   Patient Active Problem List   Diagnosis   ??? Allergic rhinitis   ??? MS (multiple sclerosis)   ??? Prostate nodule    Kenneth Wong  is here for follow up of multiple sclerosis relapsing. Comes in by himself. He reports no new events. He is on Avonex 30 mcg IM weekly. No new reported vision change. No new balance issues. No urinary or bowel symptoms. No numbness. The patient is also on vitamin D, calcium, primrose oil. Blood work last done was one week ago.    ROS:  Respiratory : no cough, no hemoptysis.  Cardiac: no chest pain. No palpitations.  Renal : no flank pain, no hematuria    Reviewed labs since last evaluation and discussed with patient.     No Known Allergies  Current outpatient prescriptions:Coenzyme Q10 (COQ10 PO), Take  by mouth daily., Disp: , Rfl: ;  VITAMIN E, Take  by mouth daily., Disp: , Rfl: ;  interferon beta-1a (AVONEX) 30 MCG/0.5ML injection, Inject 0.5 mLs into the muscle once a week., Disp: 12 kit, Rfl: 3;  gabapentin (NEURONTIN) 400 MG capsule, Take 1 capsule by mouth 2 times daily., Disp: 180 capsule, Rfl: 3;  Cholecalciferol (VITAMIN D PO), Take  by mouth daily.  , Disp: , Rfl:   B Complex Vitamins (VITAMIN B COMPLEX PO), Take  by mouth daily.  , Disp: , Rfl: ;  Loratadine (CLARITIN PO), Take  by mouth daily.  , Disp: , Rfl: ;  aspirin 81 MG tablet, Take 81 mg by mouth daily.  , Disp: , Rfl: ;  Ascorbic Acid (VITAMIN C PO), Take  by mouth daily.  , Disp: , Rfl: ;  Naproxen Sodium (ALEVE PO), Take  by mouth as needed.  , Disp: , Rfl:       PE:   Filed Vitals:    10/01/11 1620   BP: 126/77   Pulse: 69   Height: 5\' 10"  (1.778 m)   Weight: 185 lb (83.915 kg)     General Appearance:  awake, alert, oriented, in no acute distress, well developed, well nourished and cooperative  Gen: AO3, NAD, Language is Intact.  Head: PERRL, EOMI, no icterus  Neck: There is no carotid bruits. The Neck is  supple.  Neuro: CN 2-12 grossly intact with no focal deficits. Power 5/5 Throughout symmetric, Reflexes are symmetric. Long tracts are intact. Cerebellar exam is Intact. Sensory exam is intact to light touch.  Gait is intact.  Osteo: Lumbar curvatures with no scoliosis and good ROM        DATA:  Cbc and hepatic panel were normal.     Assessment:    1. Multiple sclerosis, relapsing-remitting     He is doing, no reported events. After detailed discussion with patient we agreed on the following plan.      Plan:  1.  Continue current medication.   2.  Follow up in 6 months or sooner if needed.   3. CBC, hepatic panel prior to next visit.   4. Continue calcium and vitamin D daily with primrose oil.   5. Call if any questions or concerns.      Please call if any questions.     Ma Hillock. Victorino Fatzinger MD

## 2011-10-16 LAB — POCT URINE DIPSTICK
Blood, UA POC: NEGATIVE
Leukocytes, UA: NEGATIVE
Nitrite, UA: NEGATIVE

## 2011-10-16 LAB — POST VOID RESIDUAL (PVR): post void residual: 0 ml

## 2011-10-16 MED ORDER — TAMSULOSIN HCL 0.4 MG PO CAPS
0.4 MG | ORAL_CAPSULE | Freq: Every evening | ORAL | Status: DC
Start: 2011-10-16 — End: 2011-10-16

## 2011-10-16 MED ORDER — TAMSULOSIN HCL 0.4 MG PO CAPS
0.4 MG | ORAL_CAPSULE | Freq: Every evening | ORAL | Status: DC
Start: 2011-10-16 — End: 2012-04-17

## 2011-10-16 NOTE — Progress Notes (Signed)
Subjective:      Patient ID: Kenneth Kenneth Wong, 56 y.o., male, Mar 13, 1955    Chief Complaint   Patient presents with   ??? Follow-up     urinary frequency/prostate nodule     .    Benign Prostatic Hypertrophy  This is a chronic problem. The current episode started more than 1 year ago. The problem has been gradually worsening since onset. Irritative symptoms include frequency (intermittent) and nocturia. Obstructive symptoms include an intermittent stream, a slower stream and a weak stream. Nothing aggravates the symptoms. Past treatments include nothing. The treatment provided no relief.     Kenneth Kenneth Wong presents today in follow up for a prostate nodule. He also has some lower urinary tract symptoms and is here for evaluation and treatment of BPH.    Kenneth Kenneth Wong has a prostate nodule was found in the last year. He's had this evaluated with biopsy and we then closely following his PSA. His biopsy was negative for cancer and his PSA has consistently been below one. His previous PSA was 0.48 and his most recent PSA is 0.6. His lower urinary tract symptoms have been worsening over time and is something else would like to have evaluated.      Past Medical History   Diagnosis Date   ??? Allergic rhinitis    ??? MS (multiple sclerosis) 2007     Kenneth Kenneth Wong/ +LP, +MRI   ??? Skin cancer        History     Social History   ??? Marital Status: Married     Spouse Name: Kenneth Kenneth Wong     Number of Children: 2   ??? Years of Education: N/A     Occupational History   ???  Kenneth Kenneth Wong     Social History Main Topics   ??? Smoking status: Former Smoker     Quit date: 06/26/2000   ??? Smokeless tobacco: Never Used    Comment: 1 1/2 packs a day x 30 years-quit 8 years ago   ??? Alcohol Use: Yes      6 pack/week   ??? Drug Use: No   ??? Sexually Active: Not on file     Other Topics Concern   ??? Not on file     Social History Narrative   ??? No narrative on file       Family History   Problem Relation Age of Onset   ??? Parkinsonism Mother    ??? COPD Father    ??? Prostate Cancer Father     ??? Prostate Cancer Maternal Uncle        Past Surgical History   Procedure Laterality Date   ??? Tonsillectomy and adenoidectomy  age 51   ??? Appendectomy  age 64   ??? Vasectomy     ??? Hemorrhoid surgery     ??? Hernia repair       x 2   ??? Skin cancer excision  2010     melanoma   ??? Prostate biopsy  08/2010     Kenneth Kenneth Wong       No Known Allergies    Current outpatient prescriptions:Coenzyme Q10 (COQ10 PO), Take  by mouth daily., Disp: , Rfl: ;  VITAMIN E, Take  by mouth daily., Disp: , Rfl: ;  interferon beta-1a (AVONEX) 30 MCG/0.5ML injection, Inject 0.5 mLs into the muscle once a week., Disp: 12 kit, Rfl: 3;  gabapentin (NEURONTIN) 400 MG capsule, Take 1 capsule by mouth 2 times daily., Disp: 180 capsule, Rfl:  3;  Cholecalciferol (VITAMIN D PO), Take  by mouth daily.  , Disp: , Rfl:   B Complex Vitamins (VITAMIN B COMPLEX PO), Take  by mouth daily.  , Disp: , Rfl: ;  Loratadine (CLARITIN PO), Take  by mouth daily.  , Disp: , Rfl: ;  aspirin 81 MG tablet, Take 81 mg by mouth daily.  , Disp: , Rfl: ;  Ascorbic Acid (VITAMIN C PO), Take  by mouth daily.  , Disp: , Rfl: ;  Naproxen Sodium (ALEVE PO), Take  by mouth as needed.  , Disp: , Rfl:       Review of Systems   Constitutional: Negative for fever.   Eyes: Negative for redness.   Respiratory: Negative for shortness of breath.    Cardiovascular: Negative for chest pain.   Gastrointestinal: Negative for diarrhea and constipation.   Genitourinary: Positive for frequency (intermittent), difficulty urinating (hesitancy) and nocturia.   Musculoskeletal: Negative for back pain.   Skin: Negative for rash.   Neurological: Negative for dizziness and headaches.   Hematological: Does not bruise/bleed easily.   Psychiatric/Behavioral: Negative for agitation.   All other systems reviewed and are negative.        BP 124/68   Ht 5\' 10"  (1.778 m)   Wt 185 lb (83.915 kg)   BMI 26.54 kg/m2      Objective:   Physical Exam   Nursing note and vitals reviewed.  Constitutional: He is oriented  to person, place, and time. Kenneth Wong signs are normal. He appears well-developed and well-nourished. He is cooperative. No distress.   HENT:   Head: Normocephalic and atraumatic.   Mouth/Throat: Oropharynx is clear and moist and mucous membranes are normal. No oropharyngeal exudate.   Eyes: EOM are normal. Pupils are equal, round, and reactive to light. Right eye exhibits no discharge. Left eye exhibits no discharge. No scleral icterus.   Neck: Trachea normal. No JVD present. No tracheal deviation present.   Cardiovascular: Normal rate, regular rhythm and normal pulses.    Pulses:       Radial pulses are 2+ on the right side, and 2+ on the left side.   Pulmonary/Chest: Effort normal and breath sounds normal. No respiratory distress. He has no wheezes.   Abdominal: Soft. He exhibits no distension. There is no tenderness. There is no rebound and no CVA tenderness.   Genitourinary: Rectal exam shows anal tone normal. Prostate is not tender.   Musculoskeletal: He exhibits no edema and no tenderness.   Lymphadenopathy:        Right: No supraclavicular adenopathy present.        Left: No supraclavicular adenopathy present.   Neurological: He is alert and oriented to person, place, and time. No cranial nerve deficit.   Skin: Skin is warm and dry. He is not diaphoretic.   Psychiatric: He has a normal mood and affect. His behavior is normal.     Labs    Results for POC orders placed in visit on 10/16/11   POCT URINE DIPSTICK       Result Value Range    Color, UA        Clarity, UA        Glucose, UA POC        Bilirubin, UA        Ketones, UA        Spec Grav, UA        Blood, UA POC neg      pH, UA  Protein, UA POC        Urobilinogen, UA        Leukocytes, UA neg      Nitrite, UA neg      Appearance, Fluid    Clear, Slightly Cloudy   POST VOID RESIDUAL (PVR)       Result Value Range    post void residual 0         No results found for this basename: CREATININE, BUN, NA, K, CL, CO2       Lab Results   Component Value  Date    PSA 0.48 04/21/2009     Most recent PSA: 0.6    Uroflow study carried out today with spontaneous voiding shows the following:  Average flow rate,  6.0 ml/s  Max flow,  11.1 ml/s  Voided volume,348.8  Voiding Intervals, 2  PVR on bladder scan 0 ml.  Conclusion (Siroky nomogram) obstructive      Assessment:      Traevion presents today in follow up for his prostate nodule and some moderate lower urinary tract symptoms. He reports that he is having increasing symptoms as of late. He is waking up more night and he is noticing increasing urgency, frequency, and incomplete emptying. Today on his uroflow study he did demonstrate slow flow and a pattern consistent with obstruction. Although he did not have a PVR he reports that his urination today during this testing was as good as it ever is usually is even worse.    Dearis had a recent PSA rechecked. This remains under one at 0.6. We will need to keep an eye on this over time as he did have a nodule. Despite that, with her previous biopsy, I believe our risk of cancer at this point is quite low.      Plan:      I will plan to start Taksh on Flomax. Do believe this will help some with his obstructive symptoms. It should also improve his uroflow study. I believe at this point we are low risk for prostate cancer in keeping an eye on Rennie's PSA is a reasonable strategy at this point. If the PSA continues to climb and, in particular, if it climbs quickly we may need to consider another biopsy.    I will start on Flomax today and plan to see him back in 6 months with another flow rate in PVR on arrival. He will call if he has any trouble or concerns with the Flomax.

## 2012-01-11 ENCOUNTER — Encounter

## 2012-01-11 MED ORDER — GABAPENTIN 400 MG PO CAPS
400 MG | ORAL_CAPSULE | Freq: Two times a day (BID) | ORAL | Status: DC
Start: 2012-01-11 — End: 2012-10-06

## 2012-01-11 MED ORDER — INTERFERON BETA-1A 30 MCG/0.5ML IM KIT
30 MCG/0.5ML | PACK | INTRAMUSCULAR | Status: DC
Start: 2012-01-11 — End: 2012-10-06

## 2012-04-03 NOTE — Progress Notes (Signed)
NEUROLOGY OUT PATIENT FOLLOW UP NOTE:  2/13/20144:08 PM    Kenneth Wong is here for follow up for   Patient Active Problem List   Diagnosis   ??? Allergic rhinitis   ??? Multiple sclerosis, relapsing-remitting   ??? Prostate nodule    [name]  is here for follow up of multiple sclerosis relapsing. Comes in by himself. He is doing well. Reports no new events. He is on Avonex 30 mcg IM weekly. No vision change. No urinary or bowel symptoms. No numbness. He is also on vitamin D, calcium, primrose oil.       No Known Allergies  Current outpatient prescriptions:gabapentin (NEURONTIN) 400 MG capsule, Take 1 capsule by mouth 2 times daily., Disp: 180 capsule, Rfl: 3;  interferon beta-1a (AVONEX) 30 MCG/0.5ML injection, Inject 0.5 mLs into the muscle once a week., Disp: 12 kit, Rfl: 3;  tamsulosin (FLOMAX) 0.4 MG capsule, Take 1 capsule by mouth nightly., Disp: 90 capsule, Rfl: 3;  Coenzyme Q10 (COQ10 PO), Take  by mouth daily., Disp: , Rfl:   VITAMIN E, Take  by mouth daily., Disp: , Rfl: ;  Cholecalciferol (VITAMIN D PO), Take  by mouth daily.  , Disp: , Rfl: ;  B Complex Vitamins (VITAMIN B COMPLEX PO), Take  by mouth daily.  , Disp: , Rfl: ;  Loratadine (CLARITIN PO), Take  by mouth daily.  , Disp: , Rfl: ;  aspirin 81 MG tablet, Take 81 mg by mouth daily.  , Disp: , Rfl: ;  Ascorbic Acid (VITAMIN C PO), Take  by mouth daily.  , Disp: , Rfl:   Naproxen Sodium (ALEVE PO), Take  by mouth as needed.  , Disp: , Rfl:       PE:   Filed Vitals:    04/03/12 1554   BP: 155/78   Pulse: 78   Height: 5\' 10"  (1.778 m)   Weight: 190 lb (86.183 kg)     General Appearance:  awake, alert, oriented, in no distress, well developed, well nourished and cooperative  Gen: AO3, NAD, Language is Intact.  Head: PERRL, EOMI, no icterus  Neck: There is no carotid bruits. The Neck is supple.  Neuro: CN 2-12 grossly intact with no focal deficits. Power 5/5 Throughout symmetric, Reflexes are symmetric. Long tracts are intact. Cerebellar exam is  Intact. Sensory exam is intact to light touch.  Gait is intact.  Osteo: Lumbar curvatures with no scoliosis and good ROM        DATA:  Cbc and hepatic panel were done results are pending.     Assessment:    1. Multiple sclerosis, relapsing-remitting     He is doing, no reported events. After detailed discussion with patient we agreed on the following plan.      Plan:  1.  Continue current medication.   2.  Follow up in 6 months or sooner if needed.   3. CBC, hepatic panel prior to next visit.   4. Continue calcium and vitamin D daily with primrose oil.   5. Call if any questions or concerns.      Please call if any questions.     Ma Hillock. Dalya Maselli MD

## 2012-04-03 NOTE — Patient Instructions (Addendum)
1.  Continue current medication.   2.  Follow up in 6 months or sooner if needed.   3. CBC, hepatic panel prior to next visit.   4. Continue calcium and vitamin D daily with primrose oil.   5. Call if any questions or concerns.

## 2012-04-17 LAB — POCT URINE DIPSTICK
Blood, UA POC: NEGATIVE
Leukocytes, UA: NEGATIVE
Nitrite, UA: NEGATIVE

## 2012-04-17 LAB — POST VOID RESIDUAL (PVR): post void residual: 14 ml

## 2012-04-17 MED ORDER — TAMSULOSIN HCL 0.4 MG PO CAPS
0.4 MG | ORAL_CAPSULE | Freq: Every evening | ORAL | Status: DC
Start: 2012-04-17 — End: 2013-04-21

## 2012-04-17 NOTE — Progress Notes (Signed)
Subjective:      Patient ID: Kenneth Wong, 57 y.o., male, 1955/09/23    Chief Complaint   Patient presents with   ??? Follow-up     uroflow/PVR/ PSA prior       Benign Prostatic Hypertrophy  This is a new problem. The current episode started more than 1 month ago. The problem has been gradually improving since onset. Irritative symptoms include frequency (evert hour during day; gets up once nightly only some nights). Irritative symptoms do not include urgency. Pertinent negatives include no chills, hematuria or vomiting. He is sexually active. Past treatments include tamsulosin. The treatment provided moderate relief. He has been using treatment for 6 to 12 months.   Other  This is a new (prostate nodule) problem. The current episode started more than 1 month ago. The problem occurs constantly. The problem has been unchanged. Pertinent negatives include no chest pain, chills, fever, headaches, joint swelling, rash or vomiting. Nothing aggravates the symptoms. Treatments tried: underwent prostate biopsy which was negative. The treatment provided moderate relief.       Past Medical History   Diagnosis Date   ??? Allergic rhinitis    ??? MS (multiple sclerosis) 2007     Dr. Almudallal/ +LP, +MRI   ??? Skin cancer        History     Social History   ??? Marital Status: Married     Spouse Name: Arline Asp     Number of Children: 2   ??? Years of Education: N/A     Occupational History   ???  [employer]     Social History Main Topics   ??? Smoking status: Former Smoker     Quit date: 06/26/2000   ??? Smokeless tobacco: Never Used      Comment: 1 1/2 packs a day x 30 years-quit 8 years ago   ??? Alcohol Use: Yes      Comment: 6 pack/week   ??? Drug Use: No   ??? Sexually Active: Not on file     Other Topics Concern   ??? Not on file     Social History Narrative   ??? No narrative on file       Family History   Problem Relation Age of Onset   ??? Parkinsonism Mother    ??? COPD Father    ??? Prostate Cancer Father    ??? Prostate Cancer Maternal Uncle         Past Surgical History   Procedure Laterality Date   ??? Tonsillectomy and adenoidectomy  age 32   ??? Appendectomy  age 11   ??? Vasectomy     ??? Hemorrhoid surgery     ??? Hernia repair       x 2   ??? Skin cancer excision  2010     melanoma   ??? Prostate biopsy  08/2010     dr Harle Stanford       No Known Allergies    Current outpatient prescriptions:gabapentin (NEURONTIN) 400 MG capsule, Take 1 capsule by mouth 2 times daily., Disp: 180 capsule, Rfl: 3;  interferon beta-1a (AVONEX) 30 MCG/0.5ML injection, Inject 0.5 mLs into the muscle once a week., Disp: 12 kit, Rfl: 3;  tamsulosin (FLOMAX) 0.4 MG capsule, Take 1 capsule by mouth nightly., Disp: 90 capsule, Rfl: 3;  Coenzyme Q10 (COQ10 PO), Take  by mouth daily., Disp: , Rfl:   VITAMIN E, Take  by mouth daily., Disp: , Rfl: ;  Cholecalciferol (VITAMIN D  PO), Take  by mouth daily.  , Disp: , Rfl: ;  B Complex Vitamins (VITAMIN B COMPLEX PO), Take  by mouth daily.  , Disp: , Rfl: ;  Loratadine (CLARITIN PO), Take  by mouth daily.  , Disp: , Rfl: ;  aspirin 81 MG tablet, Take 81 mg by mouth daily.  , Disp: , Rfl: ;  Ascorbic Acid (VITAMIN C PO), Take  by mouth daily.  , Disp: , Rfl:   Naproxen Sodium (ALEVE PO), Take  by mouth as needed.  , Disp: , Rfl:     Review of Systems   Constitutional: Negative for fever and chills.   HENT: Negative for facial swelling.    Eyes: Negative for pain.   Respiratory: Negative for chest tightness and shortness of breath.    Cardiovascular: Negative for chest pain.   Gastrointestinal: Negative for vomiting and diarrhea.   Genitourinary: Positive for frequency (evert hour during day; gets up once nightly only some nights). Negative for urgency, hematuria and difficulty urinating.   Musculoskeletal: Negative for back pain and joint swelling.   Skin: Negative for pallor and rash.   Neurological: Negative for dizziness and headaches.   Hematological: Does not bruise/bleed easily.   Psychiatric/Behavioral: Negative for confusion and agitation.        BP 128/78   Ht 5\' 10"  (1.778 m)   Wt 190 lb (86.183 kg)   BMI 27.26 kg/m2    Objective:   Physical Exam   Nursing note and vitals reviewed.  Constitutional: He is oriented to person, place, and time. Vital signs are normal. He appears well-developed and well-nourished. He is cooperative. No distress.   HENT:   Head: Normocephalic and atraumatic.   Mouth/Throat: Oropharynx is clear and moist and mucous membranes are normal. No oropharyngeal exudate.   Eyes: EOM are normal. Pupils are equal, round, and reactive to light. Right eye exhibits no discharge. Left eye exhibits no discharge. No scleral icterus.   Neck: Trachea normal. No JVD present. No tracheal deviation present.   Cardiovascular: Normal rate, regular rhythm and normal pulses.    Pulses:       Radial pulses are 2+ on the right side, and 2+ on the left side.   Pulmonary/Chest: Effort normal. No respiratory distress. He has no wheezes.   Abdominal: Soft. He exhibits no distension. There is no tenderness. There is no rebound and no CVA tenderness.   Genitourinary: Rectal exam shows anal tone normal. Prostate is not tender.   Musculoskeletal: He exhibits no edema and no tenderness.   Lymphadenopathy:        Right: No supraclavicular adenopathy present.        Left: No supraclavicular adenopathy present.   Neurological: He is alert and oriented to person, place, and time. No cranial nerve deficit.   Skin: Skin is warm and dry. He is not diaphoretic.   Psychiatric: He has a normal mood and affect. His behavior is normal.     Labs    Results for POC orders placed in visit on 04/17/12   POCT URINE DIPSTICK       Result Value Range    Color, UA        Clarity, UA        Glucose, UA POC        Bilirubin, UA        Ketones, UA        Spec Grav, UA        Blood, UA POC  neg      pH, UA        Protein, UA POC        Urobilinogen, UA        Leukocytes, UA neg      Nitrite, UA neg      Appearance, Fluid    Clear, Slightly Cloudy   POST VOID RESIDUAL (PVR)        Result Value Range    post void residual 14         No results found for this basename: CREATININE, BUN, NA, K, CL, CO2       Lab Results   Component Value Date    PSA 0.48 04/21/2009     PSA: 0.6, unchanged from 0.6 6 months ago.      Assessment:      Doing well with LUTS.  Flomax is working well and Kenneth Wong is pleased with results.  Overall health remains good and he is pleased his PSA has remained stable.  With a negative biopsy, his risk of a cancer is quite low, particularly with a PSA less than one.       Plan:      I have refilled his Flomax and we will continue him on this for the time being.  I will see him back in a year and we will again check his PSA prior to that visit.  We will check a flow rate and PVR on arrival at his next visit.

## 2012-09-14 ENCOUNTER — Inpatient Hospital Stay
Admit: 2012-09-14 | Discharge: 2012-09-14 | Disposition: A | Discharge status: Home / Self Care | Attending: Emergency Medicine

## 2012-09-14 MED ORDER — CEPHALEXIN 500 MG PO CAPS
500 MG | ORAL_CAPSULE | Freq: Three times a day (TID) | ORAL | Status: AC
Start: 2012-09-14 — End: 2012-09-24

## 2012-09-14 MED ORDER — MUPIROCIN 2 % EX OINT
2 % | CUTANEOUS | Status: AC
Start: 2012-09-14 — End: 2012-09-21

## 2012-09-14 NOTE — ED Provider Notes (Signed)
The history is provided by the patient.   abrasion of right hand 3 weeks ago and now redness, STS, tenderness, not responding to neosporin ointment. Pt denies pain now, no fever, vomiting, ulcer, proximal red streaks, painful or swollen glands,   Motor sensory deficits. No DM, pt UTD tetanus.     Review of Systems   Constitutional: Negative for fever, chills, appetite change, fatigue and unexpected weight change.   HENT: Negative for ear pain, congestion, sore throat, sneezing, trouble swallowing, neck pain, voice change, sinus pressure and ear discharge.    Eyes: Negative for pain, discharge and redness.   Respiratory: Negative for cough, shortness of breath, wheezing and stridor.    Cardiovascular: Negative for chest pain and leg swelling.   Gastrointestinal: Negative for nausea, vomiting, abdominal pain and diarrhea.   Genitourinary: Negative for dysuria, urgency, frequency and hematuria.   Musculoskeletal: Negative for myalgias, back pain and arthralgias.   Skin: Positive for color change and wound. Negative for rash.        STS erythema and crusted area of right dorsal hand   Neurological: Negative for dizziness, syncope, weakness and headaches.   Hematological: Negative for adenopathy.   Psychiatric/Behavioral: Negative for suicidal ideas, behavioral problems, confusion and sleep disturbance. The patient is not nervous/anxious.    All other systems reviewed and are negative.      Pulse 70   Temp(Src) 98.2 ??F (36.8 ??C)   Resp 18   Wt 180 lb (81.647 kg)   BMI 25.83 kg/m2   SpO2 98%  Past Medical History   Diagnosis Date   ??? Allergic rhinitis    ??? MS (multiple sclerosis) (HCC) 2007     Dr. Almudallal/ +LP, +MRI   ??? Skin cancer      Skin Ca removed     Past Surgical History   Procedure Laterality Date   ??? Tonsillectomy and adenoidectomy  age 31   ??? Appendectomy  age 77   ??? Vasectomy     ??? Hemorrhoid surgery     ??? Hernia repair       x 2   ??? Skin cancer excision  2010     melanoma   ??? Prostate biopsy  08/2010      dr Harle Stanford     Prior to Admission medications    Medication Sig Start Date End Date Taking? Authorizing Provider   Neomycin-Bacitracin-Polymyxin (NEOSPORIN EX) Apply  topically.   Yes Historical Provider, MD   tamsulosin (FLOMAX) 0.4 MG capsule Take 1 capsule by mouth nightly. 04/17/12 04/17/13 Yes Simonne Come, MD   gabapentin (NEURONTIN) 400 MG capsule Take 1 capsule by mouth 2 times daily. 01/11/12  Yes Cain Saupe, MD   interferon beta-1a (AVONEX) 30 MCG/0.5ML injection Inject 0.5 mLs into the muscle once a week. 01/11/12  Yes Cain Saupe, MD   Coenzyme Q10 (COQ10 PO) Take  by mouth daily.   Yes Historical Provider, MD   VITAMIN E Take  by mouth daily.   Yes Historical Provider, MD   Cholecalciferol (VITAMIN D PO) Take  by mouth daily.     Yes Historical Provider, MD   B Complex Vitamins (VITAMIN B COMPLEX PO) Take  by mouth daily.     Yes Historical Provider, MD   Loratadine (CLARITIN PO) Take  by mouth daily.     Yes Historical Provider, MD   aspirin 81 MG tablet Take 81 mg by mouth daily.     Yes Historical Provider, MD   Naproxen Sodium (  ALEVE PO) Take  by mouth as needed.     Yes Historical Provider, MD   Ascorbic Acid (VITAMIN C PO) Take  by mouth daily.      Historical Provider, MD     Family History   Problem Relation Age of Onset   ??? Parkinsonism Mother    ??? COPD Father    ??? Prostate Cancer Father    ??? Cancer Father    ??? Prostate Cancer Maternal Uncle      History     Social History   ??? Marital Status: Married     Spouse Name: Arline Asp     Number of Children: 2   ??? Years of Education: N/A     Occupational History   ???  [employer]     Social History Main Topics   ??? Smoking status: Former Smoker     Quit date: 06/26/2000   ??? Smokeless tobacco: Never Used      Comment: 1 1/2 packs a day x 30 years-quit 8 years ago   ??? Alcohol Use: Yes      Comment: less than a 6 pack/week   ??? Drug Use: No   ??? Sexual Activity: Not on file     Other Topics Concern   ??? Not on file     Social History Narrative              Physical Exam   Constitutional: He is oriented to person, place, and time. He appears well-developed and well-nourished.   Moist membranes   HENT:   Head: Normocephalic and atraumatic. No trismus in the jaw.   Right Ear: Tympanic membrane and external ear normal.   Left Ear: Tympanic membrane and external ear normal.   Nose: Nose normal.   Mouth/Throat: Oropharynx is clear and moist and mucous membranes are normal. No edematous. No oropharyngeal exudate, posterior oropharyngeal edema, posterior oropharyngeal erythema or tonsillar abscesses.   Eyes: Conjunctivae and EOM are normal. Pupils are equal, round, and reactive to light. Right eye exhibits no discharge. Left eye exhibits no discharge. No scleral icterus.   Neck: Normal range of motion. No JVD present. No Brudzinski's sign and no Kernig's sign noted. No thyromegaly present.   Cardiovascular: Normal rate, regular rhythm, S1 normal, S2 normal, normal heart sounds and intact distal pulses.  Exam reveals no gallop and no friction rub.    No murmur heard.  Pulmonary/Chest: Effort normal and breath sounds normal. No stridor. No tachypnea. No respiratory distress. He has no decreased breath sounds. He has no wheezes. He has no rhonchi. He has no rales. He exhibits no tenderness.   No cough or stridor   Abdominal: Soft. Bowel sounds are normal. He exhibits no distension and no mass. There is no tenderness. There is no rebound and no guarding.   Musculoskeletal: Normal range of motion. He exhibits no edema or tenderness.   Lymphadenopathy:     He has no cervical adenopathy.        Right cervical: No superficial cervical adenopathy present.       Left cervical: No superficial cervical adenopathy present.   Neurological: He is alert and oriented to person, place, and time. He has normal reflexes. No cranial nerve deficit. He exhibits normal muscle tone. Coordination normal.   Skin: Skin is warm and dry. No rash noted. He is not diaphoretic. There is erythema.    3 by 4 cm area on right dorsal hand with erythema, STS and central crusting,  some tenderness, no abscess. Distal neurovascular nl.    Psychiatric: He has a normal mood and affect. His behavior is normal. Judgment and thought content normal.   Nursing note and vitals reviewed.      Procedures    MDM non toxic, well hydrated, nl airway, no sepsis or deep structure infection such as tenosynovitis, osteomyelitis, fasciitis. NO neurovascular complication. Will treat with cephalexin, warm soaks, bactroban ointment, motrin, tylenol, and pt to recheck with PCP if sx persist 5 days, sooner if worse.   Problem List Items Addressed This Visit    None      Visit Diagnoses    Cellulitis of right hand    -  Primary     Relevant Medications        Neomycin-Bacitracin-Polymyxin (NEOSPORIN EX)        cephALEXin (KEFLEX) capsule        mupirocin (BACTROBAN) 2 % ointment     Abrasion of hand, right, infected, initial encounter         Relevant Medications        Neomycin-Bacitracin-Polymyxin (NEOSPORIN EX)        cephALEXin (KEFLEX) capsule        mupirocin (BACTROBAN) 2 % ointment             Labs      Radiology      EKG Interpretation.        Durel Salts, MD  09/14/12 [phone]

## 2012-09-14 NOTE — Discharge Instructions (Signed)
Skin Infections  A skin infection usually develops as a result of disruption of the skin barrier.   CAUSES   A skin infection might occur following:   Trauma or an injury to the skin such as a cut or insect sting.   Inflammation (as in eczema).   Breaks in the skin between the toes (as in athlete's foot).   Swelling (edema).  SYMPTOMS   The legs are the most common site affected. Usually there is:   Redness.   Swelling.   Pain.   There may be red streaks in the area of the infection.  TREATMENT    Minor skin infections may be treated with topical antibiotics, but if the skin infection is severe, hospital care and intravenous (IV) antibiotic treatment may be needed.   Most often skin infections can be treated with oral antibiotic medicine as well as proper rest and elevation of the affected area until the infection improves.   If you are prescribed oral antibiotics, it is important to take them as directed and to take all the pills even if you feel better before you have finished all of the medicine.   You may apply warm compresses to the area for 20-30 minutes 4 times daily.  You might need a tetanus shot now if:   You have no idea when you had the last one.   You have never had a tetanus shot before.   Your wound had dirt in it.  If you need a tetanus shot and you decide not to get one, there is a rare chance of getting tetanus. Sickness from tetanus can be serious. If you get a tetanus shot, your arm may swell and become red and warm at the shot site. This is common and not a problem.  SEEK MEDICAL CARE IF:   The pain and swelling from your infection do not improve within 2 days.   SEEK IMMEDIATE MEDICAL CARE IF:   You develop a fever, chills, or other serious problems.   Document Released: 03/15/2004 Document Revised: 04/30/2011 Document Reviewed: 01/26/2008  [facility] Patient Information 2014 [place], [place].  Cellulitis  Cellulitis is an infection of the skin and the tissue beneath it. The  infected area is usually red and tender. Cellulitis occurs most often in the arms and lower legs.   CAUSES   Cellulitis is caused by bacteria that enter the skin through cracks or cuts in the skin. The most common types of bacteria that cause cellulitis are Staphylococcus and Streptococcus.  SYMPTOMS    Redness and warmth.   Swelling.   Tenderness or pain.   Fever.  DIAGNOSIS   Your caregiver can usually determine what is wrong based on a physical exam. Blood tests may also be done.  TREATMENT   Treatment usually involves taking an antibiotic medicine.  HOME CARE INSTRUCTIONS    Take your antibiotics as directed. Finish them even if you start to feel better.   Keep the infected arm or leg elevated to reduce swelling.   Apply a warm cloth to the affected area up to 4 times per day to relieve pain.   Only take over-the-counter or prescription medicines for pain, discomfort, or fever as directed by your caregiver.   Keep all follow-up appointments as directed by your caregiver.  SEEK MEDICAL CARE IF:    You notice red streaks coming from the infected area.   Your red area gets larger or turns dark in color.   Your bone or joint  underneath the infected area becomes painful after the skin has healed.   Your infection returns in the same area or another area.   You notice a swollen bump in the infected area.   You develop new symptoms.  SEEK IMMEDIATE MEDICAL CARE IF:    You have a fever.   You feel very sleepy.   You develop vomiting or diarrhea.   You have a general ill feeling (malaise) with muscle aches and pains.  MAKE SURE YOU:    Understand these instructions.   Will watch your condition.   Will get help right away if you are not doing well or get worse.  Document Released: 11/15/2004 Document Revised: 08/07/2011 Document Reviewed: 04/23/2011  [facility] Patient Information 2014 [place].

## 2012-09-14 NOTE — ED Notes (Signed)
Patient verbalized understanding of discharge instructions. Patient informed to go to ER if develops chest pain, shortness of breath, or abdominal pain. Assessment unchanged. Patient ambulatory out in stable condition.     Sidonie Dickens, LPN  16/10/96 [id number]

## 2012-10-06 MED ORDER — INTERFERON BETA-1A 30 MCG/0.5ML IM KIT
30 MCG/0.5ML | PACK | INTRAMUSCULAR | Status: DC
Start: 2012-10-06 — End: 2013-04-09

## 2012-10-06 MED ORDER — GABAPENTIN 400 MG PO CAPS
400 MG | ORAL_CAPSULE | Freq: Two times a day (BID) | ORAL | Status: DC
Start: 2012-10-06 — End: 2013-04-09

## 2012-10-06 NOTE — Patient Instructions (Signed)
1.  Continue current medication.   2.  Follow up in 6 months or sooner if needed.   3. CBC, hepatic panel prior to next visit.   4. Continue calcium and vitamin D daily with primrose oil.   5. Call if any questions or concerns.

## 2012-10-06 NOTE — Progress Notes (Signed)
NEUROLOGY OUT PATIENT FOLLOW UP NOTE:  8/18/20144:28 PM    Kenneth Wong is here for follow up for   Patient Active Problem List   Diagnosis   ??? Allergic rhinitis   ??? Multiple sclerosis, relapsing-remitting (HCC)   ??? Prostate nodule    Kenneth Wong  is here for follow up of multiple sclerosis relapsing. He comes in by himself.  He is doing well. Reports no new events. He is on Avonex 30 mcg IM weekly. No vision change. No urinary or bowel symptoms. No numbness. He is also on vitamin D, calcium, primrose oil.       No Known Allergies  Current outpatient prescriptions:tamsulosin (FLOMAX) 0.4 MG capsule, Take 1 capsule by mouth nightly., Disp: 90 capsule, Rfl: 3, ;  gabapentin (NEURONTIN) 400 MG capsule, Take 1 capsule by mouth 2 times daily., Disp: 180 capsule, Rfl: 3, ;  interferon beta-1a (AVONEX) 30 MCG/0.5ML injection, Inject 0.5 mLs into the muscle once a week., Disp: 12 kit, Rfl: 3, ;  Coenzyme Q10 (COQ10 PO), Take  by mouth daily., Disp: , Rfl: ,   VITAMIN E, Take  by mouth daily., Disp: , Rfl: , ;  Cholecalciferol (VITAMIN D PO), Take  by mouth daily.  , Disp: , Rfl: , ;  B Complex Vitamins (VITAMIN B COMPLEX PO), Take  by mouth daily.  , Disp: , Rfl: , ;  Loratadine (CLARITIN PO), Take  by mouth daily.  , Disp: , Rfl: , ;  aspirin 81 MG tablet, Take 81 mg by mouth daily.  , Disp: , Rfl: , ;  Ascorbic Acid (VITAMIN C PO), Take  by mouth daily.  , Disp: , Rfl: ,   Naproxen Sodium (ALEVE PO), Take  by mouth as needed.  , Disp: , Rfl: , ;  Neomycin-Bacitracin-Polymyxin (NEOSPORIN EX), Apply  topically., Disp: , Rfl: ,       PE:   Filed Vitals:    10/06/12 1617   BP: 121/80   Pulse: 76   Resp: 16   Height: 5' 10.5" (1.791 m)   Weight: 192 lb (87.091 kg)     General Appearance:  awake, alert, oriented, in no distress, well developed, well nourished and cooperative  Gen: AO3, NAD, Language is Intact.  Head: PERRL, EOMI, no icterus  Neck: There is no carotid bruits. The Neck is supple.  Neuro: CN 2-12 grossly  intact with no focal deficits. Power 5/5 Throughout symmetric, Reflexes are symmetric. Long tracts are intact. Cerebellar exam is Intact. Sensory exam is intact to light touch.  Gait is intact.  Osteo: Lumbar curvatures with no scoliosis and good ROM        DATA:  CBC and hepatic panel done at [facility] 10/02/2012, wereare normal.    Assessment:    1. Multiple sclerosis, relapsing-remitting (HCC)     He is doing well. After detailed discussion with patient we agreed on the following plan.      Plan:  1.  Continue current medication.   2.  Follow up in 6 months or sooner if needed.   3. CBC, hepatic panel prior to next visit.   4. Continue calcium and vitamin D daily with primrose oil.   5. Call if any questions or concerns.      Please call if any questions.     Ma Hillock. Trenae Brunke MD

## 2013-01-31 ENCOUNTER — Inpatient Hospital Stay
Admit: 2013-01-31 | Discharge: 2013-01-31 | Disposition: A | Discharge status: Home / Self Care | Attending: Emergency Medicine

## 2013-01-31 MED ORDER — CEFUROXIME AXETIL 500 MG PO TABS
500 MG | ORAL_TABLET | Freq: Two times a day (BID) | ORAL | Status: AC
Start: 2013-01-31 — End: 2013-02-10

## 2013-01-31 NOTE — Discharge Instructions (Signed)
Bronchitis: After Your Visit  Your Care Instructions     Bronchitis is inflammation of the bronchial tubes, which carry air to the lungs. The tubes swell and produce mucus, or phlegm. The mucus and inflamed bronchial tubes make you cough. You may have trouble breathing.  Most cases of bronchitis are caused by viruses like those that cause colds. Antibiotics usually do not help and they may be harmful.  Bronchitis usually develops rapidly and lasts about 2 to 3 weeks in otherwise healthy people.  Follow-up care is a key part of your treatment and safety. Be sure to make and go to all appointments, and call your doctor if you are having problems. It's also a good idea to know your test results and keep a list of the medicines you take.  How can you care for yourself at home?   Take all medicines exactly as prescribed. Call your doctor if you think you are having a problem with your medicine.   Get some extra rest.   Take an over-the-counter pain medicine, such as acetaminophen (Tylenol), ibuprofen (Advil, Motrin), or naproxen (Aleve) to reduce fever and relieve body aches. Read and follow all instructions on the label.   Do not take two or more pain medicines at the same time unless the doctor told you to. Many pain medicines have acetaminophen, which is Tylenol. Too much acetaminophen (Tylenol) can be harmful.   Take an over-the-counter cough medicine that contains dextromethorphan to help quiet a dry, hacking cough so that you can sleep. Avoid cough medicines that have more than one active ingredient. Read and follow all instructions on the label.   Breathe moist air from a humidifier, hot shower, or sink filled with hot water. The heat and moisture will thin mucus so you can cough it out.   Do not smoke. Smoking can make bronchitis worse. If you need help quitting, talk to your doctor about stop-smoking programs and medicines. These can increase your chances of quitting for good.  When should you call for  help?  Call 911 anytime you think you may need emergency care. For example, call if:   You have severe trouble breathing.  Call your doctor now or seek immediate medical care if:   You have new or worse trouble breathing.   You cough up dark brown or bloody mucus (sputum).   You have a new or higher fever.   You have a new rash.  Watch closely for changes in your health, and be sure to contact your doctor if:   You cough more deeply or more often, especially if you notice more mucus or a change in the color of your mucus.   You are not getting better as expected.   Where can you learn more?   Go to [email or web address] and sign in to your MyChart account. Enter H333 in the Search Health Information box to learn more about "Bronchitis: After Your Visit."    If you do not have an account, please click on the "Sign Up Now" link.      2006-2014 [employer]. Care instructions adapted under license by [facility]. This care instruction is for use with your licensed healthcare professional. If you have questions about a medical condition or this instruction, always ask your healthcare professional. Healthwise, Incorporated disclaims any warranty or liability for your use of this information.  Content Version: 10.3.368381; Current as of: October 28, 2012                  Ear Infection (Otitis Media): After Your Visit  Your Care Instructions     An ear infection may start with a cold and affect the middle ear (otitis media). It can hurt a lot. Most ear infections clear up on their own in a couple of days. Most often you will not need antibiotics. This is because many ear infections are caused by a virus. Antibiotics don't work against a virus. Regular doses of pain medicines are the best way to reduce your fever and help you feel better.  Follow-up care is a key part of your treatment and safety. Be sure to make and go to all appointments, and call your doctor if you are having  problems. It's also a good idea to know your test results and keep a list of the medicines you take.  How can you care for yourself at home?   Take pain medicines exactly as directed.   If the doctor gave you a prescription medicine for pain, take it as prescribed.   If you are not taking a prescription pain medicine, take an over-the-counter medicine, such as acetaminophen (Tylenol), ibuprofen (Advil, Motrin), or naproxen (Aleve). Read and follow all instructions on the label.   Do not take two or more pain medicines at the same time unless the doctor told you to. Many pain medicines have acetaminophen, which is Tylenol. Too much acetaminophen (Tylenol) can be harmful.   Plan to take a full dose of pain reliever before bedtime. Getting enough sleep will help you get better.   Try a warm, moist washcloth on the ear. It may help relieve pain.   If your doctor prescribed antibiotics, take them as directed. Do not stop taking them just because you feel better. You need to take the full course of antibiotics.  When should you call for help?  Call your doctor now or seek immediate medical care if:   You have new or increasing ear pain.   You have new or increasing pus or blood draining from your ear.   You have a fever with a stiff neck or a severe headache.  Watch closely for changes in your health, and be sure to contact your doctor if:   You have new or worse symptoms.   You are not getting better after taking an antibiotic for 2 days.   Where can you learn more?   Go to [email or web address] and sign in to your MyChart account. Enter X558 in the Search Health Information box to learn more about "Ear Infection (Otitis Media): After Your Visit."    If you do not have an account, please click on the "Sign Up Now" link.      2006-2014 [employer]wise, [employer]. Care instructions adapted under license by [facility]. This care instruction is for use with your licensed healthcare  professional. If you have questions about a medical condition or this instruction, always ask your healthcare professional. Health[employer], Inc[employer] disclaims any warranty or liability for your use of this information.  Content Version: 10.3.368381; Current as of: November 27, 2011

## 2013-01-31 NOTE — ED Provider Notes (Addendum)
[facility] encounter                                          CHIEF COMPLAINT       Chief Complaint   Patient presents with   ??? Cough     ear pain left       Nurses Notes reviewed and I agree except as noted in the HPI.      HISTORY OF PRESENT ILLNESS    Kenneth Wong is a 57 y.o. male who presents with one week history of cough, productive of yellow sputum, congestion, ear pain, left more than right, sore throat, malaise. Patient denies ear pain after taking Motrin.  Several days ago patient had fever to 101, now resolved.  Patient denies chest pain, shortness of breath, dizziness, syncope, altered mental status, lethargy, rash, GU symptoms.  Patient received flu vaccine, nonsmoker, no history of asthma,COPD, PE or diabetes.    REVIEW OF SYSTEMS     Review of Systems   Constitutional: Positive for fever, chills, appetite change and fatigue. Negative for unexpected weight change.   HENT: Positive for congestion, ear pain, postnasal drip, rhinorrhea and sore throat. Negative for ear discharge, sinus pressure, sneezing, trouble swallowing and voice change.    Eyes: Negative for pain, discharge and redness.   Respiratory: Positive for cough. Negative for shortness of breath, wheezing and stridor.    Cardiovascular: Negative for chest pain and leg swelling.   Gastrointestinal: Negative for nausea, vomiting, abdominal pain and diarrhea.   Genitourinary: Negative for dysuria, urgency, frequency and hematuria.   Musculoskeletal: Negative for myalgias, back pain, arthralgias and neck pain.   Skin: Negative for rash.   Neurological: Negative for dizziness, syncope, weakness and headaches.   Hematological: Positive for adenopathy.   Psychiatric/Behavioral: Negative for suicidal ideas, behavioral problems, confusion and sleep disturbance. The patient is not nervous/anxious.    All other systems reviewed and are negative.         PAST MEDICAL HISTORY   Patient  has a past  medical history of Allergic rhinitis; MS (multiple sclerosis) (HCC); and Skin cancer.    SURGICAL HISTORY     Patient  has past surgical history that includes Tonsillectomy and adenoidectomy (age 72); Appendectomy (age 87); Vasectomy; Hemorrhoid surgery; hernia repair; Skin cancer excision (2010); and Prostate biopsy (08/2010).    CURRENT MEDICATIONS       Discharge Medication List as of 01/31/2013  6:33 PM      CONTINUE these medications which have NOT CHANGED    Details   gabapentin (NEURONTIN) 400 MG capsule Take 1 capsule by mouth 2 times daily., Disp-180 capsule, R-3      interferon beta-1a (AVONEX) 30 MCG/0.5ML injection Inject 0.5 mLs into the muscle once a week., Disp-12 kit, R-3      Neomycin-Bacitracin-Polymyxin (NEOSPORIN EX) Apply  topically.      tamsulosin (FLOMAX) 0.4 MG capsule Take 1 capsule by mouth nightly., Disp-90 capsule, R-3      Coenzyme Q10 (COQ10 PO) Take  by mouth daily.      VITAMIN E Take  by mouth daily.      Cholecalciferol (VITAMIN D PO) Take  by mouth daily.        B Complex Vitamins (VITAMIN B COMPLEX PO) Take  by mouth daily.        Loratadine (CLARITIN PO) Take  by mouth daily.        aspirin 81 MG tablet Take 81 mg by mouth daily.        Ascorbic Acid (VITAMIN C PO) Take  by mouth daily.        Naproxen Sodium (ALEVE PO) Take  by mouth as needed.               ALLERGIES     Patient has No Known Allergies.    FAMILY HISTORY     Patient indicated that his mother is deceased. He indicated that his father is deceased.  family history includes COPD in his father; Cancer in his father; Parkinsonism in his mother; Prostate Cancer in his father and maternal uncle.    SOCIAL HISTORY     Patient  reports that he quit smoking about 12 years ago. He has never used smokeless tobacco. He reports that he drinks alcohol. He reports that he does not use illicit drugs.    PHYSICAL EXAM     INITIAL VITALS:  height is 5\' 11"  (1.803 m) and weight is 195 lb (88.451 kg). His oral temperature is 98.5 ??F  (36.9 ??C). His blood pressure is 158/78 and his pulse is 83. His respiration is 20 and oxygen saturation is 98%.      Physical Exam   Constitutional: He is oriented to person, place, and time. He appears well-developed and well-nourished.   Dry cough, nasal voice, moist membranes, normal airway   HENT:   Head: Normocephalic and atraumatic.   Right Ear: Tympanic membrane and external ear normal.   Left Ear: External ear normal. Tympanic membrane is erythematous and retracted.   Nose: Nose normal.   Mouth/Throat: Oropharynx is clear and moist. No oropharyngeal exudate.   Eyes: Conjunctivae and EOM are normal. Pupils are equal, round, and reactive to light. Right eye exhibits no discharge. Left eye exhibits no discharge. No scleral icterus.   Neck: Normal range of motion. No JVD present. No Brudzinski's sign and no Kernig's sign noted. No thyromegaly present.   Cardiovascular: Normal rate, regular rhythm, S1 normal, S2 normal, normal heart sounds and intact distal pulses.  Exam reveals no gallop and no friction rub.    No murmur heard.  Pulmonary/Chest: Effort normal and breath sounds normal. No stridor. No tachypnea. No respiratory distress. He has no decreased breath sounds. He has no wheezes. He has no rhonchi. He has no rales. He exhibits no tenderness.   Dry cough no stridor   Abdominal: Soft. Bowel sounds are normal. He exhibits no distension and no mass. There is no tenderness. There is no rebound and no guarding.   Musculoskeletal: Normal range of motion. He exhibits no edema or tenderness.        Right lower leg: Normal.        Left lower leg: Normal.   Lymphadenopathy:     He has cervical adenopathy.        Right cervical: Superficial cervical adenopathy present.        Left cervical: Superficial cervical adenopathy present.   Neurological: He is alert and oriented to person, place, and time. He has normal reflexes. No cranial nerve deficit. He exhibits normal muscle tone. Coordination normal.   No focal  findings   Skin: Skin is warm and dry. No rash noted. He is not diaphoretic. No erythema.   Psychiatric: He has a normal mood and affect. His behavior is normal. Judgment and thought content normal.   Nursing note and vitals  reviewed.      DIAGNOSTIC RESULTS     LABS:   No results found for this visit on 01/31/13.    Imaging    URGENT CARE COURSE:   Vitals:    Filed Vitals:    01/31/13 1708   BP: 158/78   Pulse: 83   Temp: 98.5 ??F (36.9 ??C)   TempSrc: Oral   Resp: 20   Height: 5\' 11"  (1.803 m)   Weight: 195 lb (88.451 kg)   SpO2: 98%       Medications - No data to display    PROCEDURES:  None    FINAL IMPRESSION      Problem List Items Addressed This Visit     None      Visit Diagnoses    Acute left otitis media    -  Primary      Relevant Medications        cefUROXime (CEFTIN) tablet     Acute bronchitis                 DISPOSITION/PLAN   Nontoxic, well-hydrated, normal airway.  No evidence of sepsis or CNS infection, pneumonia, hypoxia, bronchospasm.  No ACS, CHF, PE. Patient has acute bronchitis and acute left otitis media.  Will treat with Ceftin, Tylenol, Motrin, increased oral clear liquids, rest, vaporizer.  Patient to recheck with PCP in 5 days if problems persist, sooner if worse.    PATIENT REFERRED TO:  Wynetta Emery, MD  [address]  [place] [address]  [phone]    Schedule an appointment as soon as possible for a visit in 5 days  recheck if problems persist      DISCHARGE MEDICATIONS:  Discharge Medication List as of 01/31/2013  6:33 PM      START taking these medications    Details   cefUROXime (CEFTIN) 500 MG tablet Take 1 tablet by mouth 2 times daily for 10 days., Disp-20 tablet, R-0               Robley Fries, MD      Durel Salts, MD  01/31/13 1840    Durel Salts, MD  01/31/13 [phone]

## 2013-01-31 NOTE — ED Notes (Signed)
Had flu and getting over it. Now has cough and left ear pain.    Coral Ceo Westwood, LPN  16/10/96 [id number]

## 2013-04-01 ENCOUNTER — Encounter

## 2013-04-01 LAB — PSA SCREENING: PSA: 0.6 ng/mL

## 2013-04-09 MED ORDER — INTERFERON BETA-1A 30 MCG/0.5ML IM KIT
30 MCG/0.5ML | PACK | INTRAMUSCULAR | Status: DC
Start: 2013-04-09 — End: 2015-03-10

## 2013-04-09 MED ORDER — GABAPENTIN 400 MG PO CAPS
400 MG | ORAL_CAPSULE | Freq: Two times a day (BID) | ORAL | Status: DC
Start: 2013-04-09 — End: 2014-04-15

## 2013-04-09 NOTE — Progress Notes (Signed)
NEUROLOGY OUT PATIENT FOLLOW UP NOTE:  2/19/20153:02 PM    Kenneth Wong is here for follow up for   Patient Active Problem List   Diagnosis   ??? Allergic rhinitis   ??? Multiple sclerosis, relapsing-remitting ([place])   ??? Prostate nodule    Kenneth Wong  is here for follow up of multiple sclerosis relapsing. He is doing well on Avonex 30 mcg IM weekly. He denies any new events. No vision change. No urinary or bowel symptoms. No numbness. He is also on vitamin D, calcium, primrose oil.       No Known Allergies  Current outpatient prescriptions:gabapentin (NEURONTIN) 400 MG capsule, Take 1 capsule by mouth 2 times daily., Disp: 180 capsule, Rfl: 3, ;  interferon beta-1a (AVONEX) 30 MCG/0.5ML injection, Inject 0.5 mLs into the muscle once a week., Disp: 12 kit, Rfl: 3, ;  Cholecalciferol (VITAMIN D PO), Take  by mouth daily.  , Disp: , Rfl: , ;  Neomycin-Bacitracin-Polymyxin (NEOSPORIN EX), Apply  topically., Disp: , Rfl: ,   tamsulosin (FLOMAX) 0.4 MG capsule, Take 1 capsule by mouth nightly., Disp: 90 capsule, Rfl: 3, ;  Coenzyme Q10 (COQ10 PO), Take  by mouth daily., Disp: , Rfl: , ;  VITAMIN E, Take  by mouth daily., Disp: , Rfl: , ;  B Complex Vitamins (VITAMIN B COMPLEX PO), Take  by mouth daily.  , Disp: , Rfl: , ;  Loratadine (CLARITIN PO), Take  by mouth daily.  , Disp: , Rfl: , ;  aspirin 81 MG tablet, Take 81 mg by mouth daily.  , Disp: , Rfl: ,   Ascorbic Acid (VITAMIN C PO), Take  by mouth daily.  , Disp: , Rfl: , ;  Naproxen Sodium (ALEVE PO), Take  by mouth as needed.  , Disp: , Rfl: ,       PE:   Filed Vitals:    04/09/13 1414   BP: 128/76   Pulse: 72   Resp: 20   Height: 5' 10.5" (1.791 m)   Weight: 192 lb (87.091 kg)     General Appearance: alert, oriented, cooperative  Gen: Language is Intact.  Head: PERRL, EOMI, no icterus  Neck: There is no carotid bruits. The Neck is supple.  Neuro: CN 2-12 grossly intact with no focal deficits. Power 5/5 Throughout symmetric, Reflexes are symmetric. Long tracts  are intact. Cerebellar exam is Intact. Sensory exam is intact to light touch.  Gait is intact.  Osteo: Lumbar curvatures with no scoliosis and good ROM        DATA:  CBC and hepatic panel done at [facility] 03/31/2013, was normal.    Assessment:    1. Multiple sclerosis ([place])     He is doing well. After a discussion with patient we agreed on the following plan.      Plan:  1.  Continue current medication.   2.  Follow up in 6 months.   3. CBC, hepatic panel prior to next visit.   4. Continue calcium and vitamin D daily with primrose oil.   5. Call if any questions or concerns.      Please call if any questions.     Anette Riedel. Irine Heminger MD

## 2013-04-09 NOTE — Patient Instructions (Signed)
1. Continue current medication.   2.  Follow up in 6 months.   3. CBC, hepatic panel prior to next visit.   4. Continue calcium and vitamin D daily with primrose oil.   5. Call if any questions or concerns.

## 2013-04-21 LAB — POCT URINE DIPSTICK
Blood, UA POC: NEGATIVE
Leukocytes, UA: NEGATIVE
Nitrite, UA: NEGATIVE

## 2013-04-21 LAB — POST VOID RESIDUAL (PVR): post void residual: 0 ml

## 2013-04-21 MED ORDER — TAMSULOSIN HCL 0.4 MG PO CAPS
0.4 MG | ORAL_CAPSULE | Freq: Every evening | ORAL | Status: DC
Start: 2013-04-21 — End: 2014-04-14

## 2013-04-21 NOTE — Progress Notes (Signed)
Subjective:      Patient ID: Kenneth Wong is a 58 y.o. male.    HPI        Kenneth Wong is here for his yearly follow-up. He was found to have a prostate nodule in June 2012 and had a TRUS biopsy in July 2012 which was negative. The patient's PSA has always been less than 1. He is having some mild symptoms that have improved since he started taking Flomax. He was previously getting up 3-4 times at night and now only gets up once. His frequency during the day has also decreased. He is very happy with his current symptom control. He denies any hematuria or dysuria. He feels that he is emptying completely.    Review of Systems   Constitutional: Negative for fever, chills, activity change, appetite change and fatigue.   Gastrointestinal: Negative for nausea and vomiting.   Genitourinary: Positive for frequency. Negative for dysuria, urgency, hematuria, flank pain and difficulty urinating.       Objective:   Physical Exam   Constitutional: He is oriented to person, place, and time. He appears well-developed and well-nourished.   HENT:   Head: Normocephalic.   Neck: Normal range of motion. Neck supple.   Pulmonary/Chest: Effort normal.   Abdominal: Soft.   Musculoskeletal: Normal range of motion.   Neurological: He is alert and oriented to person, place, and time.   Skin: Skin is warm and dry.   Psychiatric: He has a normal mood and affect. His behavior is normal. Judgment and thought content normal.     Lab Results   Component Value Date    PSA 0.6 03/31/2013    PSA 0.48 04/21/2009     Results for POC orders placed in visit on 04/21/13   POCT URINE DIPSTICK       Result Value Range    Color, UA        Clarity, UA        Glucose, UA POC        Bilirubin, UA        Ketones, UA        Spec Grav, UA        Blood, UA POC neg      pH, UA        Protein, UA POC        Urobilinogen, UA        Leukocytes, UA neg      Nitrite, UA neg      Appearance, Fluid    Clear, Slightly Cloudy   POST VOID RESIDUAL (PVR)       Result Value Range     post void residual 0         Assessment:      Prostate nodule  BPH      Plan:      Continue Flomax 0.4 mg nightly. PVR was 0, patient emptying efficiently. UA negative.    Patient will follow-up in one year with PSA prior.

## 2013-07-09 LAB — CBC
Absolute Baso #: 100 /mm3 (ref 0–200)
Absolute Eos #: 100 /mm3 (ref 0–500)
Absolute Lymph #: 1200 /mm3 (ref 1000–4800)
Absolute Mono #: 500 /mm3 (ref 0–800)
Absolute Neut #: 3800 /mm3 (ref 1800–7700)
Basophils %: 1.2 % (ref 0–2)
Eosinophils %: 1.6 % (ref 0–6)
Hematocrit: 44.4 % (ref 40.0–49.0)
Hemoglobin: 14.6 gm/dL (ref 13.5–16.5)
Lymphocytes %: 21.3 % (ref 15–45)
MCH: 29.1 PG (ref 27.5–33.0)
MCHC: 32.9 gm/dL — ABNORMAL LOW (ref 33.0–36.0)
MCV: 88.6 uL (ref 80–97)
Monocytes %: 9 % (ref 2–10)
Neutrophils %: 66.9 % (ref 40–70)
Nucleated RBCs: 0 /100 WBC (ref <1)
Platelets: 150 10*3/uL (ref 150–400)
RBC: 5.01 10*6/uL (ref 4.50–6.00)
RDW: 13.5 % (ref 12.0–16.0)
WBC: 5.7 10*3/uL (ref 4.4–10.5)

## 2013-07-09 LAB — HEPATIC FUNCTION PANEL
ALT: 26 IU/L (ref 10–40)
AST: 25 IU/L (ref 15–41)
Albumin: 4.6 gm/dL (ref 3.5–5.0)
Alkaline Phosphatase: 39 IU/L — ABNORMAL LOW (ref 41–137)
Bilirubin, Direct: <0.1 mg/dL — ABNORMAL LOW (ref 0.1–0.2)
Total Bilirubin: 0.5 mg/dL (ref 0.2–1.0)
Total Protein: 5.8 g/dL — ABNORMAL LOW (ref 6.2–8.0)

## 2013-07-09 LAB — BASIC METABOLIC PANEL, FASTING
Anion Gap: 4 (ref 4–12)
BUN: 20 mg/dL (ref 7–20)
CO2: 29 mEq/L (ref 21–32)
Calcium: 9.3 mg/dL (ref 8.8–10.5)
Chloride: 108 mEq/L (ref 101–111)
Creatinine Clearance: >60
Creatinine: 0.78 mg/dL (ref 0.70–1.30)
Glucose, Fasting: 115 mg/dL — ABNORMAL HIGH (ref 70–110)
Potassium: 4.5 mEq/L (ref 3.6–5.0)
Sodium: 141 mEq/L (ref 135–145)

## 2013-07-09 LAB — C-REACTIVE PROTEIN: CRP: <0.5 mg/dL (ref <1.0)

## 2013-07-24 LAB — SURGICAL SPECIMEN

## 2013-10-12 NOTE — Patient Instructions (Signed)
1.  EMG right upper extremity.   2. Continue current medication. Refills given.   3.  Follow up in 6 months.   4. CBC, hepatic panel prior to next visit.   5. Continue calcium and vitamin D daily with primrose oil.   6. Call if any questions or concerns.

## 2013-10-12 NOTE — Progress Notes (Signed)
NEUROLOGY OUT PATIENT FOLLOW UP NOTE:  8/24/20151:54 PM    Kenneth Wong is here for follow up for   Patient Active Problem List   Diagnosis   ??? Allergic rhinitis   ??? Multiple sclerosis, relapsing-remitting ([place])   ??? Prostate nodule    Kenneth Wong  is here for follow up of multiple sclerosis relapsing. He is doing well on Avonex 30 mcg IM weekly. He reports right hand finger tips numbness, no weakness.  No vision change. No urinary or bowel symptoms. No numbness. He is also on vitamin D, calcium, primrose oil.    ROS:  Respiratory : no cough, no shortness of breath.   Cardiac: no chest pain. No palpitations.  Renal : no flank pain, no hematuria         No Known Allergies  Current outpatient prescriptions:magnesium oxide (MAG-OX) 400 MG tablet, Take 400 mg by mouth daily., Disp: , Rfl: ;  CALCIUM PO, Take  by mouth daily., Disp: , Rfl: ;  tamsulosin (FLOMAX) 0.4 MG capsule, Take 1 capsule by mouth nightly., Disp: 90 capsule, Rfl: 3;  interferon beta-1a (AVONEX) 30 MCG/0.5ML injection, Inject 0.5 mLs into the muscle once a week., Disp: 12 kit, Rfl: 3  gabapentin (NEURONTIN) 400 MG capsule, Take 1 capsule by mouth 2 times daily., Disp: 180 capsule, Rfl: 3;  Coenzyme Q10 (COQ10 PO), Take  by mouth daily., Disp: , Rfl: ;  VITAMIN E,  Take 200 Units by mouth daily , Disp: , Rfl: ;  Cholecalciferol (VITAMIN D PO), Take  by mouth daily.  , Disp: , Rfl: ;  B Complex Vitamins (VITAMIN B COMPLEX PO), Take  by mouth daily.  , Disp: , Rfl: ;  Loratadine (CLARITIN PO), Take  by mouth daily.  , Disp: , Rfl:   Ascorbic Acid (VITAMIN C PO), Take  by mouth daily.  , Disp: , Rfl: ;  Naproxen Sodium (ALEVE PO), Take  by mouth as needed.  , Disp: , Rfl:       PE:   Filed Vitals:    10/12/13 1340   BP: 132/80   Pulse: 64   Height: 5' 11"  (1.803 m)   Weight: 192 lb (87.091 kg)     General Appearance: alert, oriented, cooperative  Gen: Language is Intact.  Head: PERRL, EOMI, no icterus  Neck: There is no carotid bruits. The Neck is  supple.  Neuro: CN 2-12 grossly intact with no focal deficits. Power 5/5 Throughout symmetric, Reflexes are symmetric. Long tracts are intact. Cerebellar exam is Intact. Sensory exam is intact to light touch.  Gait is intact.  Osteo: Lumbar curvatures with no scoliosis and good ROM in all 4 extremity joints.         DATA:  CBC and hepatic panel done at [facility] 07/09/2013, was normal.    Assessment:    1. Multiple sclerosis ([place])    2. Numbness and tingling in right hand     He is complaining of right hand numbness, without weakness,  Need to rule out entrapment neuropathy of the right upper extremity. Otherwise his MS symptoms are stable. After a discussion with patient we agreed on the following plan.      Plan:  1.  EMG right upper extremity.   2. Continue current medication. Refills given.   3.  Follow up in 6 months.   4. CBC, hepatic panel prior to next visit.   5. Continue calcium and vitamin D daily with primrose oil.  6. Call if any questions or concerns.      Please call if any questions.     Sissy Hoff MD

## 2014-04-06 ENCOUNTER — Encounter

## 2014-04-06 MED ORDER — INTERFERON BETA-1A 30 MCG IM KIT
30 MCG | PACK | INTRAMUSCULAR | Status: DC
Start: 2014-04-06 — End: 2015-03-10

## 2014-04-06 NOTE — Telephone Encounter (Signed)
Kenneth Wong called requesting a refill on the following medications:  Requested Prescriptions      No prescriptions requested or ordered in this encounter     Pharmacy verified:  .pv    Pt called requesting a refill of interferon beta-1a (AVONEX) 30 MCG/0.5ML injection.  He said he is almost out.      Date of last visit: 11/03/13  Date of next visit (if applicable): 04/15/2014

## 2014-04-13 ENCOUNTER — Encounter

## 2014-04-14 ENCOUNTER — Ambulatory Visit: Admit: 2014-04-14 | Discharge: 2014-04-14 | Payer: BLUE CROSS/BLUE SHIELD | Attending: Family | Primary: Family Medicine

## 2014-04-14 DIAGNOSIS — N402 Nodular prostate without lower urinary tract symptoms: Secondary | ICD-10-CM

## 2014-04-14 LAB — POCT URINE DIPSTICK
Blood, UA POC: NEGATIVE
Leukocytes, UA: NEGATIVE
Nitrite, UA: NEGATIVE

## 2014-04-14 MED ORDER — TAMSULOSIN HCL 0.4 MG PO CAPS
0.4 MG | ORAL_CAPSULE | Freq: Every evening | ORAL | Status: DC
Start: 2014-04-14 — End: 2015-03-28

## 2014-04-14 NOTE — Progress Notes (Signed)
Subjective:      Patient ID: Kenneth Wong is a 59 y.o. male.    HPI      Mr. Kenneth Wong is here for his yearly follow-up. He was found to have a prostate nodule in June 2012 and had a TRUS biopsy in July 2012 which was negative. The patient's PSA has always been less than 1. He does have a family history of prostate cancer with his father being diagnosed in the 4s. His most recent PSA from two weeks ago is 0.8.  He is having some mild symptoms that have improved since he started taking Flomax daily. He gets up once at night and has noticed a weakening stream over the years. He denies any recent UTI, dysuria, or hematuria.    Review of Systems   Constitutional: Negative for fever and chills.   Gastrointestinal: Negative for nausea, vomiting and abdominal pain.   Genitourinary: Negative for dysuria, urgency, frequency, hematuria and flank pain.       Objective:   Physical Exam   Constitutional: He is oriented to person, place, and time. He appears well-developed and well-nourished.   HENT:   Head: Normocephalic and atraumatic.   Right Ear: External ear normal.   Left Ear: External ear normal.   Nose: Nose normal.   Eyes: Conjunctivae are normal.   Cardiovascular: Normal rate and normal heart sounds.    Pulmonary/Chest: Effort normal and breath sounds normal.   Abdominal: Soft. Bowel sounds are normal.   Neurological: He is alert and oriented to person, place, and time.   Skin: Skin is warm and dry.   Psychiatric: He has a normal mood and affect. His behavior is normal. Judgment and thought content normal.       Lab Results   Component Value Date    PSA 0.6 03/31/2013    PSA 0.48 04/21/2009        PSA  0.8      03/2014    Assessment:      History of prostate nodule  BPH      Plan:      Patient is doing very well. He has minimal symptoms while taking Flomax daily. His PSA is WNL at 0.8. He does have a family history of prostate cancer so we will continue to check this yearly.    He will return in 1 year with PSA  prior.

## 2014-04-15 ENCOUNTER — Ambulatory Visit
Admit: 2014-04-15 | Discharge: 2014-04-15 | Payer: BLUE CROSS/BLUE SHIELD | Attending: Neurology | Primary: Family Medicine

## 2014-04-15 DIAGNOSIS — G35 Multiple sclerosis: Secondary | ICD-10-CM

## 2014-04-15 MED ORDER — GABAPENTIN 400 MG PO CAPS
400 MG | ORAL_CAPSULE | Freq: Two times a day (BID) | ORAL | Status: DC
Start: 2014-04-15 — End: 2015-04-18

## 2014-04-15 NOTE — Patient Instructions (Signed)
1. Continue current medication. Refills given.   2.  Follow up in 6 months.   3. CBC, hepatic panel.   4. Continue calcium and vitamin D daily with primrose oil.   5. Call if any questions or concerns.

## 2014-04-15 NOTE — Progress Notes (Signed)
NEUROLOGY OUT PATIENT FOLLOW UP NOTE:  2/25/20161:45 PM    Kenneth Wong is here for follow up for   Patient Active Problem List   Diagnosis   ??? Allergic rhinitis   ??? Multiple sclerosis, relapsing-remitting ([place])   ??? Prostate nodule    [name]  is here for follow up of multiple sclerosis relapsing. He is doing well on Avonex 30 mcg IM weekly. He reports right hand finger tips numbness, no weakness.  No vision change. No urinary or bowel symptoms. No numbness. He is also on vitamin D, calcium, primrose oil.    ROS:  Respiratory : no cough, no shortness of breath.   Cardiac: no chest pain. No palpitations.  Renal : no flank pain, no hematuria         No Known Allergies    Current outpatient prescriptions:   ???  tamsulosin (FLOMAX) 0.4 MG capsule, Take 1 capsule by mouth nightly, Disp: 90 capsule, Rfl: 3  ???  interferon beta-1a (AVONEX) 30 MCG injection, Inject 30 mcg into the muscle once a week, Disp: 12 kit, Rfl: 3  ???  magnesium oxide (MAG-OX) 400 MG tablet, Take 400 mg by mouth daily., Disp: , Rfl:   ???  CALCIUM PO, Take  by mouth daily., Disp: , Rfl:   ???  interferon beta-1a (AVONEX) 30 MCG/0.5ML injection, Inject 0.5 mLs into the muscle once a week., Disp: 12 kit, Rfl: 3  ???  gabapentin (NEURONTIN) 400 MG capsule, Take 1 capsule by mouth 2 times daily., Disp: 180 capsule, Rfl: 3  ???  VITAMIN E,  Take 200 Units by mouth daily , Disp: , Rfl:   ???  Cholecalciferol (VITAMIN D PO), Take  by mouth daily.  , Disp: , Rfl:   ???  B Complex Vitamins (VITAMIN B COMPLEX PO), Take  by mouth daily.  , Disp: , Rfl:   ???  Loratadine (CLARITIN PO), Take  by mouth daily.  , Disp: , Rfl:   ???  Ascorbic Acid (VITAMIN C PO), Take  by mouth daily.  , Disp: , Rfl:   ???  Naproxen Sodium (ALEVE PO), Take  by mouth as needed.  , Disp: , Rfl:       PE:   Filed Vitals:    04/15/14 1327   BP: 121/73   Pulse: 78   Height: 5' 10.87" (1.8 m)   Weight: 202 lb 3.2 oz (91.717 kg)     General Appearance: alert, oriented, cooperative  Gen: Language is  Intact.  Head: PERRL, EOMI, no icterus  Neck: There is no carotid bruits. The Neck is supple.  Neuro: CN 2-12 grossly intact with no focal deficits. Power 5/5 Throughout symmetric, Reflexes are symmetric. Long tracts are intact. Cerebellar exam is Intact. Sensory exam is intact to light touch.  Gait is intact.  Osteo: Lumbar curvatures with no scoliosis and good ROM in all 4 extremity joints.         DATA:  Results for orders placed or performed in visit on 04/14/14   POCT Urinalysis no Micro   Result Value Ref Range    Color, UA      Clarity, UA      Glucose, UA POC      Bilirubin, UA      Ketones, UA      Spec Grav, UA      Blood, UA POC neg     pH, UA      Protein, UA POC  Urobilinogen, UA      Leukocytes, UA neg     Nitrite, UA neg     Appearance, Fluid  Clear, Slightly Cloudy        Assessment:    1. Multiple sclerosis ([place])      He is doing well. No new events. He is active physically. His symptoms are stable. After a discussion with patient we agreed on the following plan.      Plan:  1. Continue current medication. Refills given.   2.  Follow up in 6 months.   3. CBC, hepatic panel.   4. Continue calcium and vitamin D daily with primrose oil.   5. Call if any questions or concerns.      Please call if any questions.     Sissy Hoff MD

## 2014-04-22 ENCOUNTER — Encounter: Attending: Family | Primary: Family Medicine

## 2014-04-22 ENCOUNTER — Encounter

## 2014-10-14 ENCOUNTER — Encounter: Attending: Neurology | Primary: Family Medicine

## 2014-11-25 ENCOUNTER — Ambulatory Visit
Admit: 2014-11-25 | Discharge: 2014-11-25 | Payer: BLUE CROSS/BLUE SHIELD | Attending: Neurology | Primary: Family Medicine

## 2014-11-25 DIAGNOSIS — G35 Multiple sclerosis: Secondary | ICD-10-CM

## 2014-11-25 NOTE — Patient Instructions (Signed)
1. Continue current medication. Refills given.   2.  Follow up in 6 months.   3. CBC, hepatic panel.   4. Continue calcium and vitamin D daily with primrose oil.   5. Call if any questions or concerns.

## 2014-11-25 NOTE — Progress Notes (Signed)
NEUROLOGY OUT PATIENT FOLLOW UP NOTE:  10/6/201611:03 AM    Kenneth Wong is here for follow up for   Patient Active Problem List   Diagnosis   ??? Allergic rhinitis   ??? Multiple sclerosis, relapsing-remitting ([place])   ??? Prostate nodule    [name]  is here for follow up of multiple sclerosis relapsing. He is doing well on Avonex 30 mcg IM weekly. He reports no weakness.  No vision change. No urinary or bowel symptoms. No numbness. He is also on vitamin D, calcium, primrose oil.    ROS:  Respiratory : no cough, no shortness of breath.   Cardiac: no chest pain. No palpitations.  Renal : no flank pain, no hematuria         No Known Allergies    Current Outpatient Prescriptions:   ???  gabapentin (NEURONTIN) 400 MG capsule, Take 1 capsule by mouth 2 times daily, Disp: 180 capsule, Rfl: 3  ???  tamsulosin (FLOMAX) 0.4 MG capsule, Take 1 capsule by mouth nightly, Disp: 90 capsule, Rfl: 3  ???  interferon beta-1a (AVONEX) 30 MCG injection, Inject 30 mcg into the muscle once a week, Disp: 12 kit, Rfl: 3  ???  magnesium oxide (MAG-OX) 400 MG tablet, Take 400 mg by mouth daily., Disp: , Rfl:   ???  VITAMIN E,  Take 200 Units by mouth daily , Disp: , Rfl:   ???  Cholecalciferol (VITAMIN D PO), Take  by mouth daily.  , Disp: , Rfl:   ???  B Complex Vitamins (VITAMIN B COMPLEX PO), Take  by mouth daily.  , Disp: , Rfl:   ???  Loratadine (CLARITIN PO), Take  by mouth daily.  , Disp: , Rfl:   ???  Ascorbic Acid (VITAMIN C PO), Take  by mouth daily.  , Disp: , Rfl:   ???  Naproxen Sodium (ALEVE PO), Take  by mouth as needed.  , Disp: , Rfl:   ???  CALCIUM PO, Take  by mouth daily., Disp: , Rfl:   ???  interferon beta-1a (AVONEX) 30 MCG/0.5ML injection, Inject 0.5 mLs into the muscle once a week., Disp: 12 kit, Rfl: 3      PE:   Vitals:    11/25/14 1043   BP: 115/72   Site: Left Arm   Position: Sitting   Pulse: 76   Weight: 201 lb (91.2 kg)   Height: 5' 10.87" (1.8 m)     General Appearance: alert, oriented, cooperative  Gen: Language is  Intact.  Head: PERRL, EOMI, no icterus  Neck: There is no carotid bruits. The Neck is supple.  Neuro: CN 2-12 grossly intact with no focal deficits. Power 5/5 Throughout symmetric, Reflexes are symmetric. Long tracts are intact. Cerebellar exam is Intact. Sensory exam is intact to light touch.  Gait is intact.  Osteo: Lumbar curvatures with no scoliosis and good ROM in all 4 extremity joints.         DATA:  Results for orders placed or performed in visit on 04/14/14   POCT Urinalysis no Micro   Result Value Ref Range    Color, UA      Clarity, UA      Glucose, UA POC      Bilirubin, UA      Ketones, UA      Spec Grav, UA      Blood, UA POC neg     pH, UA      Protein, UA POC  Urobilinogen, UA      Leukocytes, UA neg     Nitrite, UA neg     Appearance, Fluid  Clear, Slightly Cloudy        Assessment:    1. Multiple sclerosis ([place])      He is doing well. No new events. He is active physically. His symptoms are stable. He is compliant with medications. His exam is normal. After a discussion with patient we agreed on the following plan.      Plan:  1. Continue current medication. Refills given.   2.  Follow up in 6 months.   3. CBC, hepatic panel.   4. Continue calcium and vitamin D daily with primrose oil.   5. Call if any questions or concerns.      Please call if any questions.     Sissy Hoff MD

## 2014-12-13 NOTE — Telephone Encounter (Signed)
We received form from [facility] for Dr Valentina Shaggy to complete and sign. Spoke with Dr Valentina Shaggy, he does not sign these forms. [name] at [facility] notified and voiced understanding.

## 2015-01-20 ENCOUNTER — Encounter: Payer: BLUE CROSS/BLUE SHIELD | Attending: Neurology | Primary: Family Medicine

## 2015-03-10 MED ORDER — AVONEX PREFILLED 30 MCG/0.5ML IM PSKT
30 MCG/0.5ML | INJECTION | INTRAMUSCULAR | 5 refills | Status: DC
Start: 2015-03-10 — End: 2015-03-15

## 2015-03-15 MED ORDER — INTERFERON BETA-1A 30 MCG/0.5ML IM PSKT
30 MCG/0.5ML | INJECTION | INTRAMUSCULAR | 1 refills | Status: DC
Start: 2015-03-15 — End: 2015-05-26

## 2015-03-15 NOTE — Telephone Encounter (Signed)
Insurance requesting 90 day supply.

## 2015-03-28 NOTE — Telephone Encounter (Signed)
Kenneth Wong called requesting a refill on the following medications:  Requested Prescriptions     Pending Prescriptions Disp Refills   ??? tamsulosin (FLOMAX) 0.4 MG capsule [Pharmacy Med Name: Tamsulosin HCl Oral Capsule 0.4 MG] 90 capsule 2     Sig: take 1 capsule by mouth nightly.       Date of last visit:   Date of next visit (if applicable):

## 2015-03-29 MED ORDER — TAMSULOSIN HCL 0.4 MG PO CAPS
0.4 MG | ORAL_CAPSULE | ORAL | 2 refills | Status: DC
Start: 2015-03-29 — End: 2015-04-27

## 2015-04-13 NOTE — Telephone Encounter (Signed)
Pt scheduled with [place] 04-15-15, No PSA on Chart. Did he have it done? If not we will need to r/s appt. (Phone rang 5 times then cut off. No answer, no way to leave mess.)

## 2015-04-15 ENCOUNTER — Encounter: Attending: Family | Primary: Family Medicine

## 2015-04-18 ENCOUNTER — Encounter

## 2015-04-18 MED ORDER — GABAPENTIN 400 MG PO CAPS
400 MG | ORAL_CAPSULE | ORAL | 3 refills | Status: AC
Start: 2015-04-18 — End: ?

## 2015-04-25 LAB — CBC
Absolute Baso #: 100 /mm3 (ref 0–200)
Absolute Eos #: 0 /mm3 (ref 0–500)
Absolute Lymph #: 1400 /mm3 (ref 1000–4800)
Absolute Mono #: 500 /mm3 (ref 0–800)
Absolute Neut #: 3900 /mm3 (ref 1800–7700)
Basophils %: 1.2 % (ref 0–2)
Eosinophils %: 0.5 % (ref 0–6)
Hematocrit: 44 % (ref 40.0–49.0)
Hemoglobin: 14.5 gm/dL (ref 13.5–16.5)
Lymphocytes %: 23.6 % (ref 15–45)
MCH: 29.1 PG (ref 27.5–33.0)
MCHC: 33 gm/dL (ref 33.0–36.0)
MCV: 88.2 uL (ref 80–97)
Monocytes %: 8.9 % (ref 2–10)
Neutrophils %: 65.8 % (ref 40–70)
Platelets: 152 10*3/uL (ref 150–400)
RBC: 4.99 10*6/uL (ref 4.50–6.00)
RDW: 14 % (ref 12.0–16.0)
WBC: 5.9 10*3/uL (ref 4.4–10.5)

## 2015-04-25 LAB — HEPATIC FUNCTION PANEL
ALT: 28 IU/L (ref 10–40)
AST: 33 IU/L (ref 15–41)
Albumin: 4.3 gm/dL (ref 3.5–5.0)
Alkaline Phosphatase: 39 IU/L — ABNORMAL LOW (ref 41–137)
Bilirubin, Direct: 0.1 mg/dL (ref 0.1–0.2)
Total Bilirubin: 0.6 mg/dL (ref 0.2–1.0)
Total Protein: 6.4 g/dL (ref 6.2–8.0)

## 2015-04-25 LAB — PSA: PSA: 0.74 ng/mL

## 2015-04-27 ENCOUNTER — Ambulatory Visit: Admit: 2015-04-27 | Discharge: 2015-04-27 | Payer: BLUE CROSS/BLUE SHIELD | Attending: Family | Primary: Family Medicine

## 2015-04-27 DIAGNOSIS — N400 Enlarged prostate without lower urinary tract symptoms: Secondary | ICD-10-CM

## 2015-04-27 LAB — POCT URINALYSIS DIPSTICK W/O MICROSCOPE (AUTO)
Bilirubin Urine: NEGATIVE
Blood, UA POC: NEGATIVE
Glucose, Ur: NEGATIVE mg/dl
Ketones, Urine: NEGATIVE
Leukocyte Clumps, Urine: NEGATIVE
Nitrite, Urine: NEGATIVE
Protein, Urine: NEGATIVE mg/dl
Specific Gravity, Urine: 1.02 (ref 1.002–1.03)
Urobilinogen, Urine: 0.2 eu/dl (ref 0.0–1.0)
pH, Urine: 8.5 (ref 5.0–9.0)

## 2015-04-27 LAB — POST VOID RESIDUAL (PVR): post void residual: 53 ml

## 2015-04-27 MED ORDER — TAMSULOSIN HCL 0.4 MG PO CAPS
0.4 | ORAL_CAPSULE | Freq: Every day | ORAL | 3 refills | 30.00000 days | Status: AC
Start: 2015-04-27 — End: ?

## 2015-04-27 NOTE — Progress Notes (Signed)
Subjective:      Patient ID: Kenneth Wong is a 60 y.o. male.    HPI      Mr. Reineck is here for his yearly follow-up of prostate nodule and BPH. He was found to have a prostate nodule in June 2012 and had a TRUS biopsy in July 2012 which was negative. The patient's PSA has always been less than 1. He does have a family history of prostate cancer with his father being diagnosed in the 16s. He is having some mild symptoms that have improved since he started taking Flomax daily. He no longer gets up at night. He denies any recent UTI, dysuria, or hematuria.    Review of Systems   Constitutional: Negative for chills, fatigue and fever.   Gastrointestinal: Negative for abdominal pain, nausea and vomiting.   Genitourinary: Negative for dysuria, flank pain, frequency, hematuria and urgency.       Objective:   Physical Exam   Constitutional: He is oriented to person, place, and time. He appears well-developed and well-nourished.   HENT:   Head: Normocephalic and atraumatic.   Right Ear: External ear normal.   Left Ear: External ear normal.   Nose: Nose normal.   Eyes: Conjunctivae are normal.   Pulmonary/Chest: Effort normal.   Abdominal: Soft.   Neurological: He is alert and oriented to person, place, and time.   Skin: Skin is warm and dry.   Psychiatric: He has a normal mood and affect. His behavior is normal. Judgment and thought content normal.       Lab Results   Component Value Date    PSA 0.74 04/25/2015    PSA 0.6 03/31/2013    PSA 0.48 04/21/2009     Results for POC orders placed in visit on 04/27/15   POCT Urinalysis No Micro (Auto)   Result Value Ref Range    Glucose, Ur Negative NEGATIVE mg/dl    Bilirubin Urine Negative     Ketones, Urine Negative NEGATIVE    Specific Gravity, Urine 1.020 1.002 - 1.03    Blood, UA POC Negative NEGATIVE    pH, Urine 8.50 5.0 - 9.0    Protein, Urine Negative NEGATIVE mg/dl    Urobilinogen, Urine 0.20 0.0 - 1.0 eu/dl    Nitrite, Urine Negative NEGATIVE    Leukocyte Clumps,  Urine Negative NEGATIVE    Color, Urine Yellow YELLOW-STR    Character, Urine Clear [id number].CLO   poct post void residual   Result Value Ref Range    post void residual 53 ml       Assessment:      Prostate Nodule  BPH      Plan:      Patient is doing very well. He has minimal symptoms while taking Flomax daily. His PSA is WNL at 0.8. He does have a family history of prostate cancer so we will continue to check this yearly.  ??  He will return in 1 year with PSA prior. (He may be moving to [place] and if so he will follow-up with someone down there).

## 2015-05-04 NOTE — Telephone Encounter (Signed)
Should be ok. ES

## 2015-05-04 NOTE — Telephone Encounter (Signed)
Pt notified  Can pt have his re-establish appt with [facility]? Pt is ok with it

## 2015-05-04 NOTE — Telephone Encounter (Signed)
OK to re-establish, however needs seen in UC/ED for current BP issues as we will not be able to get in right away.  ES

## 2015-05-04 NOTE — Telephone Encounter (Signed)
DOLV=06-27-10. Avis called to make an appt for high BP, highest ever was is 180/105, usually been running 170/90, been going on for 1 wk, wife Arline Asp is a current patient. Will you re-establish?

## 2015-05-05 NOTE — Telephone Encounter (Signed)
appt made by [facility]

## 2015-05-05 NOTE — Telephone Encounter (Signed)
Left message to call back.

## 2015-05-16 ENCOUNTER — Encounter: Attending: Family Medicine | Primary: Family Medicine

## 2015-05-23 ENCOUNTER — Ambulatory Visit
Admit: 2015-05-23 | Discharge: 2015-05-23 | Payer: BLUE CROSS/BLUE SHIELD | Attending: Family Medicine | Primary: Family Medicine

## 2015-05-23 DIAGNOSIS — Z13220 Encounter for screening for lipoid disorders: Secondary | ICD-10-CM

## 2015-05-23 MED ORDER — LISINOPRIL 20 MG PO TABS
20 | ORAL_TABLET | Freq: Every day | ORAL | 1 refills | Status: DC
Start: 2015-05-23 — End: 2015-06-20

## 2015-05-23 NOTE — Progress Notes (Signed)
Marland Kitchen  SUBJECTIVE     Kenneth Wong is a 60 y.o.male    Pt presents to establish PCP- previous physician was this physician .  Date last seen by physician- 06/27/2010.      Pt complains of elevated BP's recently- pt stopped smoking in 2005 and noted BP's to be in 120/70's.  ~3 weeks ago, pt noted BP's to be elevated (205/105).  BP's now running in 160/90's while on Lisinopril per walk-in clinic at [facility] (started 05/05/2015).        Otherwise, pt feeling well at this time.      Current medical problems include:  Patient Active Problem List   Diagnosis   ??? Allergic rhinitis   ??? Multiple sclerosis, relapsing-remitting ([facility])   ??? Prostate nodule   ??? 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 Essential hypertension     Past Medical History   Diagnosis Date   ??? Allergic rhinitis    ??? MS (multiple sclerosis) ([facility]) 2007     Dr. Almudallal/ +LP, +MRI   ??? Skin cancer      Skin Ca removed     Past Surgical History   Procedure Laterality Date   ??? Tonsillectomy and adenoidectomy  age 64   ??? Appendectomy  age 71   ??? Vasectomy     ??? Hemorrhoid surgery     ??? Hernia repair       x 2   ??? Skin cancer excision  2010     melanoma   ??? Prostate biopsy  08/2010     dr Harle Stanford   ??? Skin cancer excision  03/2013   ??? Colonoscopy     ??? Carpal tunnel release Right      No Known Allergies       Current Outpatient Prescriptions:   ???  lisinopril (PRINIVIL;ZESTRIL) 20 MG tablet, Take 1 tablet by mouth daily, Disp: 30 tablet, Rfl: 1  ???  tamsulosin (FLOMAX) 0.4 MG capsule, Take 1 capsule by mouth daily, Disp: 90 capsule, Rfl: 3  ???  gabapentin (NEURONTIN) 400 MG capsule, TAKE ONE CAPSULE BY MOUTH TWICE A DAY, Disp: 180 capsule, Rfl: 3  ???  interferon beta-1a (AVONEX PREFILLED) 30 MCG/0.5ML injection, INJECT INTO THE MUSCLE ONCE A WEEK., Disp: 12 Syringe, Rfl: 1  ???  magnesium oxide (MAG-OX) 400 MG tablet, Take 400 mg by mouth daily., Disp: , Rfl:   ???  VITAMIN E,  Take 200 Units by mouth daily , Disp: , Rfl:   ???   Cholecalciferol (VITAMIN D PO), Take  by mouth daily.  , Disp: , Rfl:   ???  B Complex Vitamins (VITAMIN B COMPLEX PO), Take  by mouth daily.  , Disp: , Rfl:   ???  Loratadine (CLARITIN PO), Take  by mouth daily.  , Disp: , Rfl:   ???  Ascorbic Acid (VITAMIN C PO), Take  by mouth daily.  , Disp: , Rfl:   ???  Naproxen Sodium (ALEVE PO), Take  by mouth as needed.  , Disp: , Rfl:     Family History   Problem Relation Age of Onset   ??? Parkinsonism Mother    ??? COPD Father    ??? Prostate Cancer Father    ??? Cancer Father    ??? Prostate Cancer Maternal Uncle        Social History   Substance Use Topics   ??? Smoking status: Former Smoker     Quit date: 06/26/2000   ???  Smokeless tobacco: Never Used      Comment: 1 1/2 packs a day x 30 years-quit 8 years ago   ??? Alcohol use Yes      Comment: less than a 6 pack/week       Review of Systems   Constitutional: Negative for chills, diaphoresis, fatigue, fever and unexpected weight change.   Eyes: Negative for visual disturbance.   Respiratory: Positive for shortness of breath (in AM intermittently. ). Negative for chest tightness.    Cardiovascular: Negative for chest pain, palpitations and leg swelling.   Gastrointestinal: Negative for abdominal pain, anal bleeding, blood in stool, constipation, diarrhea, nausea and vomiting.   Genitourinary: Negative for dysuria and hematuria.   Musculoskeletal: Negative for neck pain.   Neurological: Positive for headaches (dull intermittently). Negative for dizziness and light-headedness.     OBJECTIVE     Visit Vitals   ??? BP (!) 150/82 (Site: Left Arm, Position: Sitting)   ??? Pulse 72   ??? Temp 98.4 ??F (36.9 ??C) (Oral)   ??? Resp 14   ??? Ht 5\' 11"  (1.803 m)   ??? Wt 209 lb 3.2 oz (94.9 kg)   ??? BMI 29.18 kg/m2       Physical Exam   Constitutional: He is oriented to person, place, and time. He appears well-developed and well-nourished.   HENT:   Head: Normocephalic and atraumatic.   Right Ear: External ear normal.   Left Ear: External ear normal.   Nose: Nose  normal.   Mouth/Throat: Oropharynx is clear and moist.   Eyes: Conjunctivae and EOM are normal. Pupils are equal, round, and reactive to light.   Neck: Normal range of motion. Neck supple.   Cardiovascular: Normal rate, regular rhythm and intact distal pulses.    Pulmonary/Chest: Effort normal and breath sounds normal.   Abdominal: Soft. Bowel sounds are normal.   Musculoskeletal: Normal range of motion.   Neurological: He is alert and oriented to person, place, and time. He has normal reflexes.   Skin: Skin is warm and dry.   Psychiatric: He has a normal mood and affect. His behavior is normal. Judgment and thought content normal.   Nursing note and vitals reviewed.      Lab Results   Component Value Date    CHOL 150 08/12/2010    TRIG 39 08/12/2010    HDL 53 08/12/2010    LDLCALC 89 08/12/2010     Lab Results   Component Value Date    NA 141 07/09/2013    K 4.5 07/09/2013    CL 108 07/09/2013    CO2 29 07/09/2013    BUN 20 07/09/2013    CREATININE 0.78 07/09/2013    CALCIUM 9.3 07/09/2013    PROT 6.4 04/25/2015    LABALBU 4.3 04/25/2015    BILITOT 0.6 04/25/2015    ALKPHOS 39 (L) 04/25/2015    AST 33 04/25/2015    ALT 28 04/25/2015     Lab Results   Component Value Date    WBC 5.9 04/25/2015    HGB 14.5 04/25/2015    HCT 44.0 04/25/2015    MCV 88.2 04/25/2015    PLT 152 04/25/2015     Lab Results   Component Value Date    PSA 0.74 04/25/2015    PSA 0.6 03/31/2013    PSA 0.48 04/21/2009       Immunization History   Administered Date(s) Administered   ??? Influenza Virus Vaccine 11/20/2007, 12/20/2009   ??? Td 02/20/2003  Health Maintenance   Topic Date Due   ??? Hepatitis C screen  07/14/1955   ??? HIV screen  03/18/1969   ??? Diabetes screen  03/18/1994   ??? DTaP/Tdap/Td vaccine (1 - Tdap) 02/21/2003   ??? Zostavax vaccine  03/18/2014   ??? Lipid screen  08/12/2015   ??? Flu vaccine (Season Ended) 09/20/2015   ??? Colon cancer screen colonoscopy  11/20/2023     Future Appointments  Date Time Provider Department Center    05/26/2015 10:15 AM Elbert Ewings, MD [facility] [place]   04/25/2016 9:15 AM Regan Lemming, NP [facility] [place]     ASSESSMENT      1. Screening, lipid  Lipid Panel   2. Essential hypertension  EKG 12 Lead    Lipid Panel    Basic Metabolic Panel    lisinopril (PRINIVIL;ZESTRIL) 20 MG tablet   3. 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 Screening for HIV without presence of risk factors  HIV Screen   5. Need for hepatitis C screening test  Hepatitis C Antibody   6. Allergic rhinitis, unspecified allergic rhinitis trigger, unspecified rhinitis seasonality       PLAN     Encouraged annual FLU VACCINE after October 1st- pt missed in 2016-2017.    Encouraged TETANUS SHOT (TdaP/ ADACEL/ BOOSTRIX) per personal pharmacy.  Check with Dr. Valentina Shaggy re Shingles Shot (Zostavax) due to current use of Avonex.    COLONOSCOPY done 11/19/2013 per Dr. Marland Mcalpine- to do again in 2018 due to multiple polyps.    BPH and PSA management per Dr. Valla Leaver office.   Check HEP C and HIV screening at this time.   After discussion with pt, will increase Lisinopril to 20 mg- 1 pill daily.  #30/1 refill.    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  Check 12 lead EKG due to new onset hypertension.    Continue current medicines  Follow up in 1 month.       Electronically signed by Wynetta Emery, MD on 05/23/2015 at 2:01 PM

## 2015-05-23 NOTE — Patient Instructions (Addendum)
Encouraged annual FLU VACCINE after October 1st- pt missed in 2016-2017.    Encouraged TETANUS SHOT (TdaP/ ADACEL/ BOOSTRIX) per personal pharmacy.  Check with Dr. Valentina Shaggy re Shingles Shot (Zostavax) due to current use of Avonex.    COLONOSCOPY done 11/19/2013 per Dr. Marland Mcalpine- to do again in 2018 due to multiple polyps.    BPH and PSA management per Dr. Valla Leaver office.   Check HEP C and HIV screening at this time.   After discussion with pt, will increase Lisinopril to 20 mg- 1 pill daily.  #30/1 refill.    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  Check 12 lead EKG due to new onset hypertension.    Continue current medicines  Follow up in 1 month.

## 2015-05-26 ENCOUNTER — Ambulatory Visit
Admit: 2015-05-26 | Discharge: 2015-05-26 | Payer: BLUE CROSS/BLUE SHIELD | Attending: Neurology | Primary: Family Medicine

## 2015-05-26 DIAGNOSIS — G35 Multiple sclerosis: Secondary | ICD-10-CM

## 2015-05-26 MED ORDER — INTERFERON BETA-1A 30 MCG/0.5ML IM PSKT
30 | INJECTION | INTRAMUSCULAR | 2 refills | Status: DC
Start: 2015-05-26 — End: 2015-09-14

## 2015-05-26 NOTE — Progress Notes (Signed)
NEUROLOGY OUT PATIENT FOLLOW UP NOTE:  4/6/201710:57 AM    Kenneth Wong is here for follow up for   Patient Active Problem List   Diagnosis   ??? Allergic rhinitis   ??? Multiple sclerosis, relapsing-remitting (HCC)   ??? Prostate nodule   ??? 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 Essential hypertension    Kenneth Wong  is here for follow up of multiple sclerosis relapsing. He is doing well on Avonex 30 mcg IM weekly. He reports no weakness.  No vision change. No urinary or bowel symptoms. No numbness. He is also on vitamin D, calcium, primrose oil.    ROS:  Respiratory : no cough, no shortness of breath.   Cardiac: no chest pain. No palpitations.  Renal : no flank pain, no hematuria         No Known Allergies    Current Outpatient Prescriptions:   ???  lisinopril (PRINIVIL;ZESTRIL) 20 MG tablet, Take 1 tablet by mouth daily, Disp: 30 tablet, Rfl: 1  ???  tamsulosin (FLOMAX) 0.4 MG capsule, Take 1 capsule by mouth daily, Disp: 90 capsule, Rfl: 3  ???  gabapentin (NEURONTIN) 400 MG capsule, TAKE ONE CAPSULE BY MOUTH TWICE A DAY, Disp: 180 capsule, Rfl: 3  ???  interferon beta-1a (AVONEX PREFILLED) 30 MCG/0.5ML injection, INJECT INTO THE MUSCLE ONCE A WEEK., Disp: 12 Syringe, Rfl: 1  ???  magnesium oxide (MAG-OX) 400 MG tablet, Take 400 mg by mouth daily., Disp: , Rfl:   ???  VITAMIN E,  Take 200 Units by mouth daily , Disp: , Rfl:   ???  Cholecalciferol (VITAMIN D PO), Take  by mouth daily.  , Disp: , Rfl:   ???  B Complex Vitamins (VITAMIN B COMPLEX PO), Take  by mouth daily.  , Disp: , Rfl:   ???  Loratadine (CLARITIN PO), Take  by mouth daily.  , Disp: , Rfl:   ???  Ascorbic Acid (VITAMIN C PO), Take  by mouth daily.  , Disp: , Rfl:   ???  Naproxen Sodium (ALEVE PO), Take  by mouth as needed.  , Disp: , Rfl:       PE:   Vitals:    05/26/15 1049   BP: 132/80   Site: Left Arm   Position: Sitting   Pulse: 95   Weight: 215 lb 9.6 oz (97.8 kg)   Height: 5' 10.87" (1.8 m)     General Appearance:  alert, oriented, cooperative  Gen: Language is Intact.  Head: PERRL, EOMI, no icterus  Neck: There is no carotid bruits. The Neck is supple.  Neuro: CN 2-12 grossly intact with no focal deficits. Power 5/5 Throughout symmetric, Reflexes are symmetric. Long tracts are intact. Cerebellar exam is Intact. Sensory exam is intact to light touch.  Gait is intact.  Osteo: Lumbar curvatures with no scoliosis and good ROM in all 4 extremity joints.         DATA:  04/25/2015  4:58 PM - Edi, [facility] Component Results   Component Value Ref Range & Units Status   AST 33 15 - 41 IU/L Final          Alkaline Phosphatase 39 (L) 41 - 137 IU/L Final   Total Bilirubin 0.6 0.2 - 1.0 mg/dL Final   Bilirubin, Direct 0.1 0.1 - 0.2 mg/dL Final   Alb 4.3 3.5 - 5.0 gm/dL Final   Total Protein 6.4 6.2 - 8.0 g/dL Final  ALT 28 10 - 40 IU/L      04/25/2015  4:36 PM - Edi, [facility] Component Results   Component Value Ref Range & Units Status   WBC 5.9 4.4 - 10.5 th/cmm Final          RBC 4.99 4.50 - 6.00 mil/cmm Final   Hemoglobin 14.5 13.5 - 16.5 gm/dL Final   Hematocrit 98.1 40.0 - 49.0 % Final   MCV 88.2 80 - 97 CU MIC Final   MCH 29.1 27.5 - 33.0 PG Final   MCHC 33.0 33.0 - 36.0 gm/dL Final   RDW 19.1 47.8 - 16.0 % Final   Platelets 152 150 - 400 th/cmm Final   Neutrophils % 65.8 40 - 70 % Final   Lymphocytes Relative 23.6 15 - 45 % Final   Monocytes % 8.9 2 - 10 % Final   Eosinophils % 0.5 0 - 6 % Final   Basophils % 1.2 0 - 2 % Final   Absolute Neut # 3900 1800 - 7700 /cmm Final   Absolute Lymph # 1400 1000 - 4800 /cmm Final   Absolute Mono # 500 0 - 800 /cmm Final   Absolute Eos # 0 0 - 500 /cmm Final   Absolute Baso # 100 0 - 200 /cmm Final         Assessment:    1. Multiple sclerosis, relapsing-remitting (HCC)      He is doing well on avonex. He is relocating to [place]. He denies new events. He is active physically. His symptoms are stable. He is compliant with medications. His exam is normal. After a discussion with patient  we agreed on the following plan.      Plan:  1. Continue current medication. Refills given.   2. CBC, hepatic panel every 6 months.  3. Continue calcium and vitamin D daily with primrose oil.   4. Call if any questions or concerns.      Please call if any questions.     Elbert Ewings MD

## 2015-05-26 NOTE — Patient Instructions (Addendum)
1. Continue current medication. Refills given.   2. CBC, hepatic panel every 6 months.   3. Continue calcium and vitamin D daily with primrose oil.   4. Call if any questions or concerns.

## 2015-06-07 LAB — BASIC METABOLIC PANEL, FASTING
Anion Gap: 8 (ref 4–12)
BUN: 19 mg/dL (ref 7–20)
CO2: 28 mEq/L (ref 21–32)
Calcium: 9.2 mg/dL (ref 8.8–10.5)
Chloride: 104 mEq/L (ref 101–111)
Creatinine Clearance: >60
Creatinine: 0.76 mg/dL (ref 0.70–1.30)
Glucose, Fasting: 109 mg/dL (ref 70–110)
Potassium: 4.9 mEq/L (ref 3.6–5.0)
Sodium: 140 mEq/L (ref 135–145)

## 2015-06-07 LAB — LIPID PANEL
CHOLESTEROL/HDL RELATIVE RISK: 3 — ABNORMAL LOW (ref 4.0–5.0)
Cholesterol: 225 mg/dL — ABNORMAL HIGH (ref <200)
Direct-LDL / HDL Risk: 2.2 (ref <3.1)
HDL: 74 mg/dL
LDL Direct: 168 mg/dl — ABNORMAL HIGH
Triglycerides: 81 mg/dL (ref <150)
VLDL: 16 mg/dL (ref <39)

## 2015-06-07 LAB — HEPATITIS C ANTIBODY: HCV Ab: NEGATIVE

## 2015-06-07 LAB — HIV TEST: HIV-1 Antibody: NEGATIVE

## 2015-06-20 ENCOUNTER — Ambulatory Visit
Admit: 2015-06-20 | Discharge: 2015-06-20 | Payer: BLUE CROSS/BLUE SHIELD | Attending: Family Medicine | Primary: Family Medicine

## 2015-06-20 DIAGNOSIS — R7301 Impaired fasting glucose: Secondary | ICD-10-CM

## 2015-06-20 MED ORDER — ATORVASTATIN CALCIUM 10 MG PO TABS
10 MG | ORAL_TABLET | Freq: Every day | ORAL | 1 refills | Status: DC
Start: 2015-06-20 — End: 2015-08-02

## 2015-06-20 MED ORDER — LISINOPRIL-HYDROCHLOROTHIAZIDE 20-12.5 MG PO TABS
ORAL_TABLET | Freq: Every day | ORAL | 1 refills | Status: DC
Start: 2015-06-20 — End: 2015-08-02

## 2015-06-20 NOTE — Progress Notes (Signed)
SUBJECTIVE     Kenneth Wong is a 60 y.o.male    Pt stable since last visit- no new problems for diagnoses listed below:  Patient Active Problem List   Diagnosis   ??? Allergic rhinitis   ??? Multiple sclerosis, relapsing-remitting (HCC)   ??? Prostate nodule   ??? 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 Essential hypertension     The home BP readings have been in the 111-182 / 62-94 range.  Pt currently on Lisinopril 20 mg-1 pill daily.  No side effects.      Otherwise, pt feeling good at this time.     Review of Systems   Constitutional: Negative for chills, diaphoresis, fatigue, fever and unexpected weight change.   Eyes: Negative for visual disturbance.   Respiratory: Negative for chest tightness and shortness of breath.    Cardiovascular: Negative for chest pain, palpitations and leg swelling.   Gastrointestinal: Negative for abdominal pain, anal bleeding, blood in stool, constipation, diarrhea, nausea and vomiting.   Genitourinary: Negative for dysuria and hematuria.   Musculoskeletal: Negative for neck pain.   Neurological: Negative for dizziness, light-headedness and headaches.     OBJECTIVE     BP 138/86   Pulse 68   Temp 98.3 ??F (36.8 ??C) (Oral)    Resp 16   Wt 213 lb (96.6 kg)   BMI 29.82 kg/m2    Wt Readings from Last 3 Encounters:   06/20/15 213 lb (96.6 kg)   05/26/15 215 lb 9.6 oz (97.8 kg)   05/23/15 209 lb 3.2 oz (94.9 kg)     Body mass index is 29.82 kg/(m^2).      Physical Exam   Constitutional: He is oriented to person, place, and time. He appears well-developed and well-nourished.   HENT:   Head: Normocephalic and atraumatic.   Right Ear: External ear normal.   Left Ear: External ear normal.   Nose: Nose normal.   Mouth/Throat: Oropharynx is clear and moist.   Eyes: Conjunctivae and EOM are normal. Pupils are equal, round, and reactive to light.   Neck: Normal range of motion. Neck supple.   Cardiovascular: Normal rate, regular rhythm and intact distal pulses.     Pulmonary/Chest: Effort normal and breath sounds normal.   Abdominal: Soft. Bowel sounds are normal.   Musculoskeletal: Normal range of motion.   Neurological: He is alert and oriented to person, place, and time. He has normal reflexes.   Skin: Skin is warm and dry.   Psychiatric: He has a normal mood and affect. His behavior is normal. Judgment and thought content normal.   Nursing note and vitals reviewed.    Component      Latest Ref Rng & Units 06/07/2015   Sodium      135 - 145 mEq/L 140   Potassium      3.6 - 5.0 mEq/L 4.9   Chloride      101 - 111 mEq/L 104   CO2      21 - 32 mEq/L 28   Anion Gap      4 - 12 8   Glucose, Fasting      70 - 110 mg/dL 646   BUN      7 - 20 mg/dL 19   Creatinine      8.03 - 1.30 mg/dL 2.12   Creatinine Clearance       >60   Calcium      8.8 - 10.5 mg/dL  9.2   LDL Direct      mg/dl 295 (H)   Cholesterol      <200 mg/dL 621 (H)   Triglycerides      <150 mg/dL 81   HDL Cholesterol      mg/dL 74   CHOLESTEROL/HDL RELATIVE RISK      4.0 - 5.0 3.0 (L)   Direct-LDL / HDL Risk      <3.1 2.2   VLDL      <39 mg/dL 16   HIV-1 Antibody      Negative Negative   HCV Ab      Negative Negative     The 10-year ASCVD risk score Kenneth Wong DC Montez Hageman, et al., 2013) is: 10.5%    Values used to calculate the score:      Age: 14 years      Sex: Male      Is Non-Hispanic African American: No      Diabetic: No      Tobacco smoker: No      Systolic Blood Pressure: 138 mmHg      Is BP treated: Yes      HDL Cholesterol: 74 mg/dL      Total Cholesterol: 225 mg/dL    No results found for: LABA1C    Lab Results   Component Value Date    CHOL 225 (H) 06/07/2015    TRIG 81 06/07/2015    HDL 74 06/07/2015    LDLCALC 89 08/12/2010    LDLDIRECT 168 (H) 06/07/2015       Lab Results   Component Value Date    NA 140 06/07/2015    K 4.9 06/07/2015    CL 104 06/07/2015    CO2 28 06/07/2015    BUN 19 06/07/2015    CREATININE 0.76 06/07/2015    CALCIUM 9.2 06/07/2015    PROT 6.4 04/25/2015    LABALBU 4.3 04/25/2015    BILITOT 0.6  04/25/2015    ALKPHOS 39 (L) 04/25/2015    AST 33 04/25/2015    ALT 28 04/25/2015       Lab Results   Component Value Date    WBC 5.9 04/25/2015    HGB 14.5 04/25/2015    HCT 44.0 04/25/2015    MCV 88.2 04/25/2015    PLT 152 04/25/2015       Lab Results   Component Value Date    PSA 0.74 04/25/2015    PSA 0.6 03/31/2013    PSA 0.48 04/21/2009       Immunization History   Administered Date(s) Administered   ??? Influenza Virus Vaccine 11/20/2007, 12/20/2009   ??? Td 02/20/2003       Health Maintenance   Topic Date Due   ??? DTaP/Tdap/Td vaccine (1 - Tdap) 02/21/2003   ??? Zostavax vaccine  06/19/2016 (Originally 03/18/2014)   ??? Flu vaccine (Season Ended) 09/20/2015   ??? Diabetes screen  06/07/2018   ??? Lipid screen  06/06/2020   ??? Colon cancer screen colonoscopy  11/20/2023   ??? Hepatitis C screen  Completed   ??? HIV screen  Completed       Future Appointments  Date Time Provider Department Center   04/25/2016 9:15 AM Regan Lemming, NP [facility] [place]       ASSESSMENT      1. IFG (impaired fasting glucose)  Hemoglobin A1C   2. Essential hypertension  lisinopril-hydrochlorothiazide (PRINZIDE;ZESTORETIC) 20-12.5 MG per tablet    Basic Metabolic Panel   3.  Hyperlipidemia, unspecified hyperlipidemia type  atorvastatin (LIPITOR) 10 MG tablet    Cholesterol, Total    AST    ALT    LDL Cholesterol, Direct   4. Multiple sclerosis, relapsing-remitting (HCC)         PLAN      Encouraged annual FLU VACCINE after October 1st- pt missed in 2016-2017.  Encouraged TETANUS SHOT (TdaP/ ADACEL/ BOOSTRIX) per personal pharmacy.  Will hold Shingles Shot (Zostavax) as this is a live virus vaccine due to MS.    COLONOSCOPY done 11/19/2013 per Dr. Marland Mcalpine- to do again in 2018 due to multiple polyps.  BPH and PSA management per Dr. Valla Wong office- to follow up 04/25/2016.   After discussion with pt, will change to  Lisinopril/HCT  to 20/12.5 mg- 1 pill daily. #30/1 refill.   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    Call update on BP's in 3-4 weeks.   Will start Lipitor 10 mg- 1 pill daily.  #30/1 refill.   Check HGA1C, TC, D-LDL, AST/ALT, BMP in 6 weeks.    Continue current medicines  Follow up in 6-8 weeks.      Electronically signed by Wynetta Emery, MD on 06/20/2015 at 3:17 PM

## 2015-06-20 NOTE — Patient Instructions (Signed)
Encouraged annual FLU VACCINE after October 1st- pt missed in 2016-2017.  Encouraged TETANUS SHOT (TdaP/ ADACEL/ BOOSTRIX) per personal pharmacy.  Will hold Shingles Shot (Zostavax) as this is a live virus vaccine due to MS.    COLONOSCOPY done 11/19/2013 per Dr. Marland Mcalpine- to do again in 2018 due to multiple polyps.  BPH and PSA management per Dr. Valla Leaver office- to follow up 04/25/2016.   After discussion with pt, will change to  Lisinopril/HCT  to 20/12.5 mg- 1 pill daily. #30/1 refill.   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    Call update on BP's in 3-4 weeks.   Will start Lipitor 10 mg- 1 pill daily.  #30/1 refill.   Check HGA1C, TC, D-LDL, AST/ALT, BMP in 6 weeks.    Continue current medicines  Follow up in 6-8 weeks.

## 2015-07-06 LAB — CBC
Absolute Baso #: 100 /mm3 (ref 0–200)
Absolute Eos #: 100 /mm3 (ref 0–500)
Absolute Lymph #: 1400 /mm3 (ref 1000–4800)
Absolute Mono #: 600 /mm3 (ref 0–800)
Absolute Neut #: 4600 /mm3 (ref 1800–7700)
Basophils %: 1.2 % (ref 0–2)
Eosinophils %: 1.2 % (ref 0–6)
Hematocrit: 43.4 % (ref 40.0–49.0)
Hemoglobin: 14.4 gm/dL (ref 13.5–16.5)
Lymphocytes %: 20.7 % (ref 15–45)
MCH: 29.1 PG (ref 27.5–33.0)
MCHC: 33.2 gm/dL (ref 33.0–36.0)
MCV: 87.8 uL (ref 80–97)
Monocytes %: 9.1 % (ref 2–10)
Neutrophils %: 67.8 % (ref 40–70)
Nucleated RBCs: 0.1 /100 WBC (ref <1)
Platelets: 188 10*3/uL (ref 150–400)
RBC: 4.94 10*6/uL (ref 4.50–6.00)
RDW: 13.1 % (ref 12.0–16.0)
WBC: 6.8 10*3/uL (ref 4.4–10.5)

## 2015-07-06 LAB — TROPONIN: Troponin I: <0.01 ng/mL (ref 0.00–0.02)

## 2015-07-06 LAB — CK WITH REFLEX CK-MB
CK-MB Index: 1.8 % (ref 0.0–5.0)
CK-MB: 5.6 ng/mL (ref 0.0–7.0)

## 2015-07-06 LAB — COMPREHENSIVE METABOLIC PANEL
ALT: 28 IU/L (ref 10–40)
AST: 32 IU/L (ref 15–41)
Albumin/Globulin Ratio: 2.9 — ABNORMAL HIGH (ref 1.5–2.5)
Albumin: 4.6 gm/dL (ref 3.5–5.0)
Alkaline Phosphatase: 44 IU/L (ref 41–137)
Anion Gap: 6 (ref 4–12)
BUN: 23 mg/dL — ABNORMAL HIGH (ref 7–20)
CO2: 28 mEq/L (ref 21–32)
Calcium: 9.3 mg/dL (ref 8.8–10.5)
Chloride: 103 mEq/L (ref 101–111)
Creatinine Clearance: >60
Creatinine: 0.76 mg/dL (ref 0.70–1.30)
Glucose: 87 mg/dL (ref 70–110)
Potassium: 4.4 mEq/L (ref 3.6–5.0)
Sodium: 137 mEq/L (ref 135–145)
Total Bilirubin: 0.5 mg/dL (ref 0.2–1.0)
Total Protein: 6.2 g/dL (ref 6.2–8.0)

## 2015-07-06 LAB — CK-MB: Total CK: 300 IU/L (ref 49–397)

## 2015-07-08 ENCOUNTER — Ambulatory Visit
Admit: 2015-07-08 | Discharge: 2015-07-08 | Payer: BLUE CROSS/BLUE SHIELD | Attending: Family Medicine | Primary: Family Medicine

## 2015-07-08 DIAGNOSIS — R0602 Shortness of breath: Secondary | ICD-10-CM

## 2015-07-08 MED ORDER — FAMOTIDINE 20 MG PO TABS
20 MG | ORAL_TABLET | Freq: Two times a day (BID) | ORAL | 0 refills | Status: DC
Start: 2015-07-08 — End: 2015-08-02

## 2015-07-08 NOTE — Progress Notes (Signed)
SUBJECTIVE     Kenneth Wong is a 60 y.o.male      Pt complains of SOB with exertion beginning ~ 1 week ago, significantly worse 3 and 4 days ago with exercise.  Pt was seen at walk-in clinic 2 days ago and work-up negative.  Pt states he awoke with burning in chest this morning, that rapidly improved after getting upright.  Pt states pain completely relieved after taking aspirin this AM.     \\No prolonged periods of travel.  No calf warmth, redness, or tenderness.     The home BP readings have been <140/90 over past 2 weeks.      Review of Systems   Constitutional: Negative for chills, diaphoresis, fatigue, fever and unexpected weight change.   Eyes: Negative for visual disturbance.   Respiratory: Positive for shortness of breath. Negative for chest tightness.    Cardiovascular: Negative for chest pain, palpitations and leg swelling.   Gastrointestinal: Negative for abdominal pain, anal bleeding, blood in stool, constipation, diarrhea, nausea and vomiting.   Genitourinary: Negative for dysuria and hematuria.   Musculoskeletal: Negative for neck pain.   Neurological: Positive for light-headedness (with rapid breathing 3-4 days ago- none since). Negative for dizziness and headaches.     OBJECTIVE     BP 110/66 (Site: Left Arm, Position: Sitting, Cuff Size: Large Adult)   Pulse 86   Temp 98.4 ??F (36.9 ??C) (Oral)    Resp 16   Wt 208 lb (94.3 kg)   SpO2 94%   BMI 29.12 kg/m2    Physical Exam   Constitutional: He is oriented to person, place, and time. He appears well-developed and well-nourished.   HENT:   Head: Normocephalic and atraumatic.   Right Ear: External ear normal.   Left Ear: External ear normal.   Nose: Nose normal.   Mouth/Throat: Oropharynx is clear and moist.   Eyes: Conjunctivae and EOM are normal. Pupils are equal, round, and reactive to light.   Neck: Normal range of motion. Neck supple.   Cardiovascular: Normal rate, regular rhythm and intact distal pulses.    Pulmonary/Chest: Effort normal and  breath sounds normal.   Abdominal: Soft. Bowel sounds are normal.   Musculoskeletal: Normal range of motion.   Neurological: He is alert and oriented to person, place, and time. He has normal reflexes.   Skin: Skin is warm and dry.   Psychiatric: He has a normal mood and affect. His behavior is normal. Judgment and thought content normal.   Nursing note and vitals reviewed.          Component      Latest Ref Rng & Units 07/06/2015   WBC      4.4 - 10.5 th/cmm 6.8   RBC      4.50 - 6.00 mil/cmm 4.94   Hemoglobin Quant      13.5 - 16.5 gm/dL 14.4   Hematocrit      40.0 - 49.0 % 43.4   MCV      80 - 97 CU MIC 87.8   MCH      27.5 - 33.0 PG 29.1   MCHC      33.0 - 36.0 gm/dL 33.2   RDW      12.0 - 16.0 % 13.1   Platelet Count      150 - 400 th/cmm 188   Neutrophils %      40 - 70 % 67.8   Lymphocyte %      15 -  45 % 20.7   Monocytes %      2 - 10 % 9.1   Eosinophils %      0 - 6 % 1.2   Basophils %      0 - 2 % 1.2   Nucleated Red Blood Cells      <1 /100 WBC 0.1   Absolute Neut #      1800 - 7700 /cmm 4600   Absolute Lymph #      1000 - 4800 /cmm 1400   Absolute Mono #      0 - 800 /cmm 600   Absolute Eos #      0 - 500 /cmm 100   Basophils #      0 - 200 /cmm 100   Sodium      135 - 145 mEq/L 137   Potassium      3.6 - 5.0 mEq/L 4.4   Chloride      101 - 111 mEq/L 103   CO2      21 - 32 mEq/L 28   Anion Gap      4 - 12 6   Glucose      70 - 110 mg/dL 87   BUN      7 - 20 mg/dL 23 (H)   Creatinine      0.70 - 1.30 mg/dL 0.76   Creatinine Clearance       >60   AST      15 - 41 IU/L 32   Alk Phos      41 - 137 IU/L 44   Bilirubin      0.2 - 1.0 mg/dL 0.5   Calcium      8.8 - 10.5 mg/dL 9.3   Albumin      3.5 - 5.0 gm/dL 4.6   Total Protein      6.2 - 8.0 g/dL 6.2   Albumin/Globulin Ratio      1.5 - 2.5 2.9 (H)   ALT      10 - 40 IU/L 28   CK-MB      0.0 - 7.0 ng/mL 5.6   CK-MB Index      0.0 - 5.0 % 1.8   Troponin I      0.00 - 0.02 ng/mL < 0.01   Total CK      49 - 397 IU/L 300     No results found for: TSH, T3TOTAL,  T4TOTAL, THYROIDAB      XR CHEST STANDARD- 07/06/2015  FINDINGS: ??   ????   The lungs are clear and expanded. ??There is no demonstrated pleural ??   abnormality. ??   ????   Normal size heart. ?? Normal mediastinum and hila. ??Normal visualized ??   pulmonary arteries. ??There is atherosclerosis of the aortic arch. ??   ????   Normal visualized thoracic spine. ??Normal visualized ribs, clavicles, and ??   shoulders. ??   ????   There is no demonstrated abnormality of the visualized soft tissue ??   structures of the upper abdomen. ??   ___________________________________ ??   ????   IMPRESSION: ??   Normal x-ray examination of the chest. ??   ????   Electronically Signed: ??   Edman Circle, MD ??   2015/07/06 at 10:21 EDT          No future appointments.      ASSESSMENT      1. Shortness of breath on exertion  EKG 12 Lead    NM Myocardial Spect Rest Exercise or Rx    Pulmonary function test    T4, Free    TSH without Reflex   2. Atypical chest pain  EKG 12 Lead    famotidine (PEPCID) 20 MG tablet    NM Myocardial Spect Rest Exercise or Rx    Pulmonary function test    T4, Free    TSH without Reflex   3. Lightheaded  EKG 12 Lead       PLAN     Check 12 lead EKG on pt at this time as he had symptoms earlier today.   Check Stress Cardiolite and PFT's at this time.   Check FREE T4 and TSH.    Start Pepcid 20 mg- 1 pill 2x/daily at this time. #60/ no refills.  Increase daily water intake to > 64 ounces daily.   Will hold on PE work-up as pt has no risk factors, no SOB at rest, and pulse ox of 94% on RA.    Further management pending outcome of above tests.  To ED for any acute symptoms.   Follow up in 2 weeks       Electronically signed by Kelly Splinter, MD on 07/08/2015 at 9:24 AM

## 2015-07-08 NOTE — Patient Instructions (Addendum)
Check 12 lead EKG on pt at this time as he had symptoms earlier today.   Check Stress Cardiolite and PFT's at this time.   Check FREE T4 and TSH.    Start Pepcid 20 mg- 1 pill 2x/daily at this time. #60/ no refills.  Increase daily water intake to > 64 ounces daily.   Will hold on PE work-up as pt has no risk factors, no SOB at rest, and pulse ox of 94% on RA.    Further management pending outcome of above tests.  To ED for any acute symptoms.   Follow up in 2 weeks

## 2015-07-08 NOTE — Progress Notes (Signed)
The pt is scheduled for complete PFT's at [facility] on 07/14/15 at 12 PM. He is to arrive at the 2nd floor heart center at 11:30 AM. Instructions given to pt and he is agreeable.

## 2015-07-08 NOTE — Progress Notes (Signed)
The pt is scheduled for a Cardiolite stress test at [facility] with Dr. Jeronimo Greaves on 07/12/15 at 9:30 AM. He is to arrive at 9:30 AM. Pt is to hold his BP medication the day of testing. Pt notified of instructions and is agreeable. Order faxed to [phone]. Order also faxed to [facility] for pre-cert.

## 2015-07-14 ENCOUNTER — Inpatient Hospital Stay: Admit: 2015-07-14 | Attending: Family Medicine | Primary: Family Medicine

## 2015-07-14 DIAGNOSIS — R0602 Shortness of breath: Secondary | ICD-10-CM

## 2015-07-14 NOTE — Telephone Encounter (Signed)
Left message to call back.

## 2015-07-14 NOTE — Telephone Encounter (Signed)
Pt called for results of stress test  I called [facility] medical records, will fax results    Results received and scanned into media, please review. Thanks

## 2015-07-14 NOTE — Telephone Encounter (Signed)
Pt notified  Has not seen Dr Jeronimo Greaves yet, no appt scheduled at this time.

## 2015-07-14 NOTE — Telephone Encounter (Signed)
Stress test normal.  Did pt see Dr. Jeronimo Greaves?  ES

## 2015-07-20 LAB — BASIC METABOLIC PANEL
Anion Gap: 6 (ref 4–12)
BUN: 20 mg/dL (ref 7–20)
CO2: 30 mEq/L (ref 21–32)
Calcium: 9.4 mg/dL (ref 8.8–10.5)
Chloride: 104 mEq/L (ref 101–111)
Creatinine Clearance: >60
Creatinine: 0.81 mg/dL (ref 0.70–1.30)
Glucose: 102 mg/dL (ref 70–110)
Potassium: 4.1 mEq/L (ref 3.6–5.0)
Sodium: 140 mEq/L (ref 135–145)

## 2015-07-20 LAB — ALT: ALT: 25 IU/L (ref 10–40)

## 2015-07-20 LAB — AST: AST: 25 IU/L (ref 15–41)

## 2015-07-20 LAB — T4, FREE: T4 Free: 1.02 ng/dL (ref 0.61–1.12)

## 2015-07-20 LAB — HEMOGLOBIN A1C
Hemoglobin A1C: 5.8 % (ref 4.4–6.4)
eAG: 120 mg/dL

## 2015-07-20 LAB — LDL CHOLESTEROL, DIRECT: LDL Direct: 96 mg/dl

## 2015-07-20 LAB — TSH: TSH: 0.52 mcIU/mL (ref 0.49–4.67)

## 2015-07-20 LAB — CHOLESTEROL, TOTAL: Cholesterol: 146 mg/dL (ref <200)

## 2015-08-02 ENCOUNTER — Ambulatory Visit
Admit: 2015-08-02 | Discharge: 2015-08-02 | Payer: BLUE CROSS/BLUE SHIELD | Attending: Family Medicine | Primary: Family Medicine

## 2015-08-02 DIAGNOSIS — I10 Essential (primary) hypertension: Secondary | ICD-10-CM

## 2015-08-02 MED ORDER — ATORVASTATIN CALCIUM 10 MG PO TABS
10 | ORAL_TABLET | Freq: Every day | ORAL | 3 refills | Status: AC
Start: 2015-08-02 — End: ?

## 2015-08-02 MED ORDER — LISINOPRIL-HYDROCHLOROTHIAZIDE 20-12.5 MG PO TABS
20-12.5 | ORAL_TABLET | Freq: Every day | ORAL | 3 refills | Status: AC
Start: 2015-08-02 — End: ?

## 2015-08-02 MED ORDER — ALBUTEROL SULFATE HFA 108 (90 BASE) MCG/ACT IN AERS
108 (90 Base) MCG/ACT | Freq: Four times a day (QID) | RESPIRATORY_TRACT | 1 refills | Status: AC | PRN
Start: 2015-08-02 — End: ?

## 2015-08-02 NOTE — Progress Notes (Signed)
SUBJECTIVE     Kenneth Wong is a 60 y.o.male      Pt stable since last visit- no new problems for diagnoses listed below:  Patient Active Problem List   Diagnosis   ??? Allergic rhinitis   ??? Multiple sclerosis, relapsing-remitting (HCC)   ??? Prostate nodule   ??? 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 Essential hypertension       Current Outpatient Prescriptions   Medication Sig Dispense Refill   ??? aspirin 81 MG tablet Take 81 mg by mouth daily     ??? lisinopril-hydrochlorothiazide (PRINZIDE;ZESTORETIC) 20-12.5 MG per tablet Take 1 tablet by mouth daily 90 tablet 3   ??? atorvastatin (LIPITOR) 10 MG tablet Take 1 tablet by mouth daily 90 tablet 3   ??? albuterol sulfate HFA (PROAIR HFA) 108 (90 Base) MCG/ACT inhaler Inhale 2 puffs into the lungs every 6 hours as needed for Wheezing 3 Inhaler 1   ??? POTASSIUM PO Take by mouth daily     ??? interferon beta-1a (AVONEX PREFILLED) 30 MCG/0.5ML injection INJECT INTO THE MUSCLE ONCE A WEEK. 12 Syringe 2   ??? tamsulosin (FLOMAX) 0.4 MG capsule Take 1 capsule by mouth daily 90 capsule 3   ??? gabapentin (NEURONTIN) 400 MG capsule TAKE ONE CAPSULE BY MOUTH TWICE A DAY 180 capsule 3   ??? magnesium oxide (MAG-OX) 400 MG tablet Take 400 mg by mouth daily.     ??? VITAMIN E   Take 200 Units by mouth daily      ??? Cholecalciferol (VITAMIN D PO) Take  by mouth daily.       ??? B Complex Vitamins (VITAMIN B COMPLEX PO) Take  by mouth daily.       ??? Loratadine (CLARITIN PO) Take  by mouth daily.       ??? Ascorbic Acid (VITAMIN C PO) Take  by mouth daily.       ??? Naproxen Sodium (ALEVE PO) Take  by mouth as needed.         No current facility-administered medications for this visit.      Pt states he has new job in [facility]- to complete work 08/12/2015 and move to [facility] shortly after that.      Pt had stress test since last visit. Pt also had PFT's.  Since time of PFT's, pt states he has had absolutely no SOB or CP.  He has maintained normal activity levels and had no  further issues.     The home BP readings have been in the 120-130's / 70-80's range.                         Review of Systems   Constitutional: Negative for chills, diaphoresis, fatigue, fever and unexpected weight change.   Eyes: Negative for visual disturbance.   Respiratory: Negative for chest tightness and shortness of breath.    Cardiovascular: Negative for chest pain, palpitations and leg swelling.   Gastrointestinal: Negative for abdominal pain, anal bleeding, blood in stool, constipation, diarrhea, nausea and vomiting.   Genitourinary: Negative for dysuria and hematuria.   Musculoskeletal: Negative for neck pain.   Neurological: Negative for dizziness, light-headedness and headaches.           OBJECTIVE     BP 124/80 (Site: Left Arm, Position: Sitting)   Pulse 80   Temp 98 ??F (36.7 ??C) (Oral)    Resp 16   Wt 207 lb (  93.9 kg)   BMI 28.98 kg/m2    Wt Readings from Last 3 Encounters:   08/02/15 207 lb (93.9 kg)   07/08/15 208 lb (94.3 kg)   06/20/15 213 lb (96.6 kg)     Body mass index is 28.98 kg/(m^2).      Physical Exam   Constitutional: He is oriented to person, place, and time. He appears well-developed and well-nourished.   HENT:   Head: Normocephalic and atraumatic.   Right Ear: External ear normal.   Left Ear: External ear normal.   Nose: Nose normal.   Mouth/Throat: Oropharynx is clear and moist.   Eyes: Conjunctivae and EOM are normal. Pupils are equal, round, and reactive to light.   Neck: Normal range of motion. Neck supple.   Cardiovascular: Normal rate, regular rhythm and intact distal pulses.    Pulmonary/Chest: Effort normal and breath sounds normal.   Abdominal: Soft. Bowel sounds are normal.   Musculoskeletal: Normal range of motion.   Neurological: He is alert and oriented to person, place, and time. He has normal reflexes.   Skin: Skin is warm and dry.   Psychiatric: He has a normal mood and affect. His behavior is normal. Judgment and thought content normal.   Nursing note and vitals  reviewed.       Component      Latest Ref Rng & Units 07/20/2015   Sodium      135 - 145 mEq/L 140   Potassium      3.6 - 5.0 mEq/L 4.1   Chloride      101 - 111 mEq/L 104   CO2      21 - 32 mEq/L 30   Anion Gap      4 - 12 6   Glucose      70 - 110 mg/dL 161   BUN      7 - 20 mg/dL 20   Creatinine      0.96 - 1.30 mg/dL 0.45   Creatinine Clearance       >60   Calcium      8.8 - 10.5 mg/dL 9.4   Hemoglobin W0J      4.4 - 6.4 % 5.8   eAG (mg/dL)      mg/dL 811   LDL Direct      mg/dl 96   AST      15 - 41 IU/L 25   Cholesterol      <200 mg/dL 914   ALT      10 - 40 IU/L 25   TSH      0.49 - 4.67 mcIU/mL 0.52   T4 Free      0.61 - 1.12 ng/dL 7.82       Lab Results   Component Value Date    LABA1C 5.8 07/20/2015       Lab Results   Component Value Date    CHOL 146 07/20/2015    TRIG 81 06/07/2015    HDL 74 06/07/2015    LDLCALC 89 08/12/2010    LDLDIRECT 96 07/20/2015       Lab Results   Component Value Date    NA 140 07/20/2015    K 4.1 07/20/2015    CL 104 07/20/2015    CO2 30 07/20/2015    BUN 20 07/20/2015    CREATININE 0.81 07/20/2015    GLUCOSE 102 07/20/2015    CALCIUM 9.4 07/20/2015    PROT 6.2 07/06/2015    LABALBU 4.6 07/06/2015  BILITOT 0.5 07/06/2015    ALKPHOS 44 07/06/2015    AST 25 07/20/2015    ALT 25 07/20/2015    AGRATIO 2.9 (H) 07/06/2015           Lab Results   Component Value Date    TSH 0.52 07/20/2015       Lab Results   Component Value Date    WBC 6.8 07/06/2015    HGB 14.4 07/06/2015    HCT 43.4 07/06/2015    MCV 87.8 07/06/2015    PLT 188 07/06/2015       Lab Results   Component Value Date    PSA 0.74 04/25/2015    PSA 0.6 03/31/2013    PSA 0.48 04/21/2009       Immunization History   Administered Date(s) Administered   ??? Influenza Virus Vaccine 11/20/2007, 12/20/2009   ??? Td 02/20/2003       Health Maintenance   Topic Date Due   ??? DTaP/Tdap/Td vaccine (1 - Tdap) 02/21/2003   ??? Zostavax vaccine  06/19/2016 (Originally 03/18/2014)   ??? Flu vaccine (Season Ended) 09/20/2015   ??? Diabetes screen   07/20/2018   ??? Lipid screen  07/19/2020   ??? Colon cancer screen colonoscopy  11/20/2023   ??? Hepatitis C screen  Completed   ??? HIV screen  Completed       No future appointments.    ASSESSMENT      1. Essential hypertension  lisinopril-hydrochlorothiazide (PRINZIDE;ZESTORETIC) 20-12.5 MG per tablet   2. Hyperlipidemia, unspecified hyperlipidemia type  atorvastatin (LIPITOR) 10 MG tablet   3. RAD (reactive airway disease), unspecified asthma severity, uncomplicated  albuterol sulfate HFA (PROAIR HFA) 108 (90 Base) MCG/ACT inhaler   4. Multiple sclerosis, relapsing-remitting (HCC)     5. Prostate nodule     6. 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 Allergic rhinitis, unspecified allergic rhinitis trigger, unspecified rhinitis seasonality         PLAN      Encouraged annual FLU VACCINE after October 1st.    Encouraged TETANUS SHOT (TdaP/ ADACEL/ BOOSTRIX) per personal pharmacy.  Will hold Shingles Shot (Zostavax) as this is a live virus vaccine due to MS  COLONOSCOPY done 11/19/2013 per Dr. Marland Mcalpine- to do again in 11/2016 due to multiple polyps.  BPH, Prostate Nodule, and PSA management per Dr. Valla Leaver office previously- please get established with Urologist in [facility] after your move- to follow up in 04/2016   After discussion with pt, given response to bronchodilators and that pt is asymptomatic for past 2-3 weeks, will start only ProAir- 1-2 puffs every 4-6 hours as needed for Shortness of Breath.  #3/ 1 refill  Recommend HGA1C, FLP, CMP, and PSA after 06/06/2016 in [facility] with your new physician.    Continue increased daily water intake at > 64 ounces daily.   Continue current medicines.  Refills    Follow up with your new physician in [place] within 6 months.           Electronically signed by Wynetta Emery, MD on 08/02/2015 at 11:15 AM

## 2015-08-02 NOTE — Patient Instructions (Signed)
Encouraged annual FLU VACCINE after October 1st.    Encouraged TETANUS SHOT (TdaP/ ADACEL/ BOOSTRIX) per personal pharmacy.  Will hold Shingles Shot (Zostavax) as this is a live virus vaccine due to MS  COLONOSCOPY done 11/19/2013 per Dr. Marland Mcalpine- to do again in 11/2016 due to multiple polyps.  BPH, Prostate Nodule, and PSA management per Dr. Valla Leaver office previously- please get established with Urologist in [facility] after your move- to follow up in 04/2016   After discussion with pt, given response to bronchodilators and that pt is asymptomatic for past 2-3 weeks, will start only ProAir- 1-2 puffs every 4-6 hours as needed for Shortness of Breath.  #3/ 1 refill  Recommend HGA1C, FLP, CMP, and PSA after 06/06/2016 in [facility] with your new physician.    Continue increased daily water intake at > 64 ounces daily.   Continue current medicines.  Refills    Follow up with your new physician in [place] within 6 months.

## 2015-08-16 ENCOUNTER — Encounter: Attending: Family Medicine | Primary: Family Medicine

## 2015-09-14 MED ORDER — INTERFERON BETA-1A 30 MCG/0.5ML IM PSKT
30 | INJECTION | INTRAMUSCULAR | 2 refills | Status: AC
Start: 2015-09-14 — End: ?

## 2016-04-25 ENCOUNTER — Encounter: Attending: Family | Primary: Family Medicine

## 2016-08-04 ENCOUNTER — Encounter

## 2016-08-06 NOTE — Telephone Encounter (Signed)
Pt no longer under our care.

## 2021-08-24 IMAGING — MR MRI CERVICAL SPINE W/WO CONTRAST
4 of 7 series · 18 of 48 positions shown · IV contrast (Gadolinium)
Comparison: Brain MRI August 24, 2021 and MR thoracic spine August 24, 2021.

MRI CERVICAL SPINE W/WO CONTRAST , 08/24/2021 [DATE]: 
CLINICAL INDICATION: Tingling and pain. History of melanoma. History of multiple 
sclerosis. Tingling in the left lower extremity and now the right lower cavity.
TECHNIQUE: Multiplanar, multiecho position MR images of the cervical spine were 
performed without and with 7 cc of Vueway was injected.

[Series 3001: T2 · sagittal · 3.0mm · 0.43mm/px · 3 of 17 slices shown (1 of 2)]
[im 1/17]
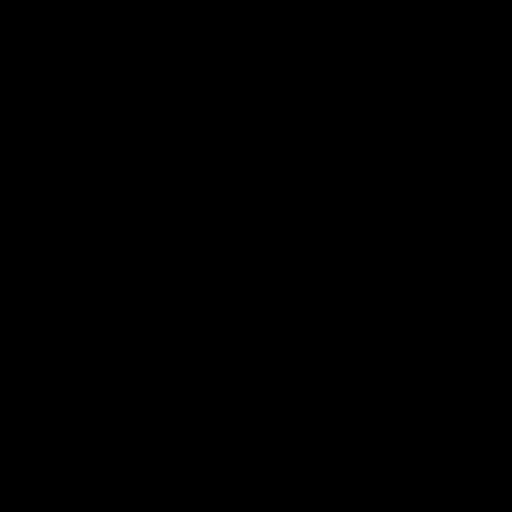
[im 9/17]
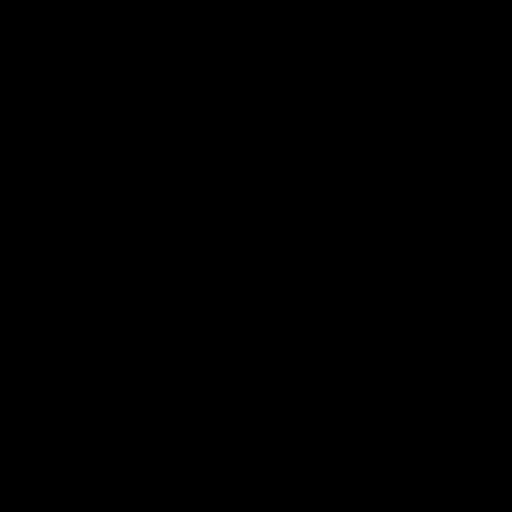
[im 17/17]
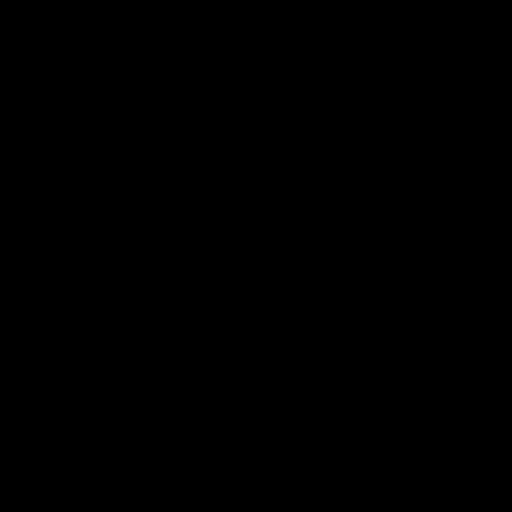

[Series 4001: T1 · sagittal · 3.0mm · 0.43mm/px · 3 of 17 slices shown (1 of 2)]
[im 1/17]
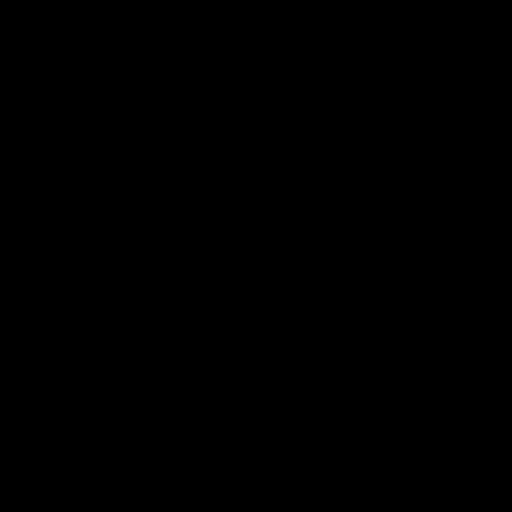
[im 11/17]
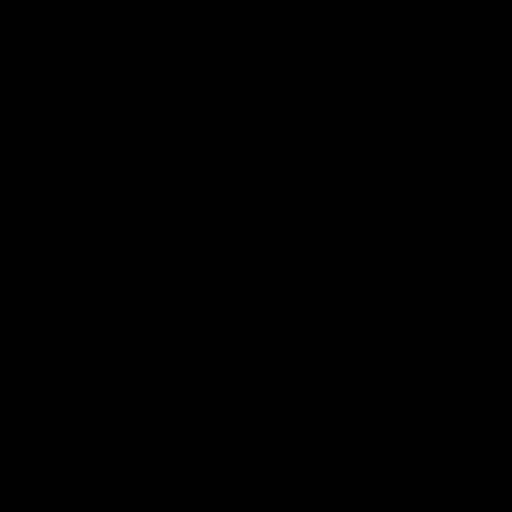
[im 17/17]
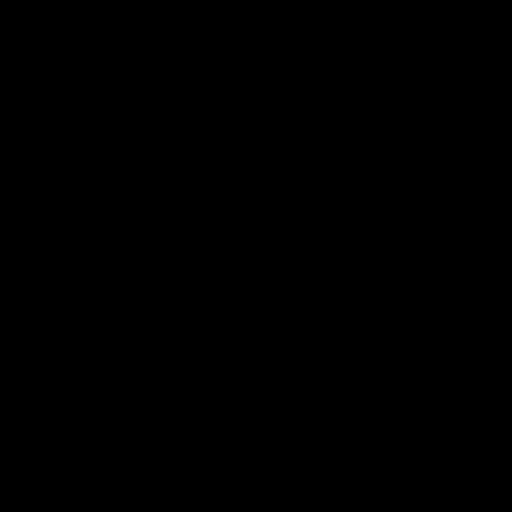

[Series 6001: T2 · axial · 3.0mm · 0.37mm/px · z∈[-24,+96]mm · 9 of 44 slices shown (2 of 2)]
[im 1/44]
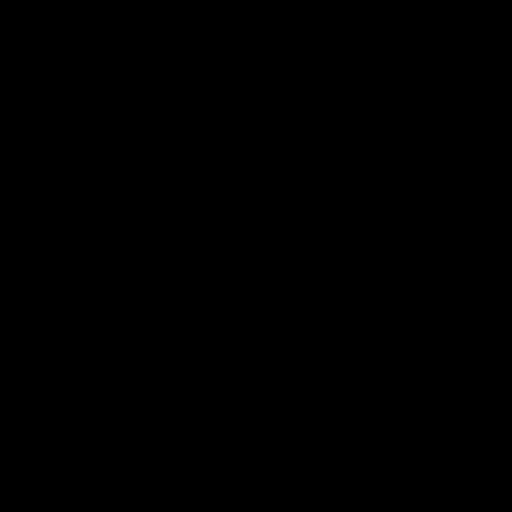
[im 5/44]
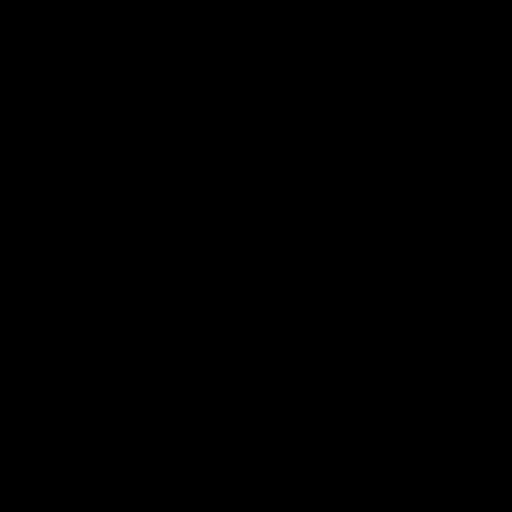
[im 9/44]
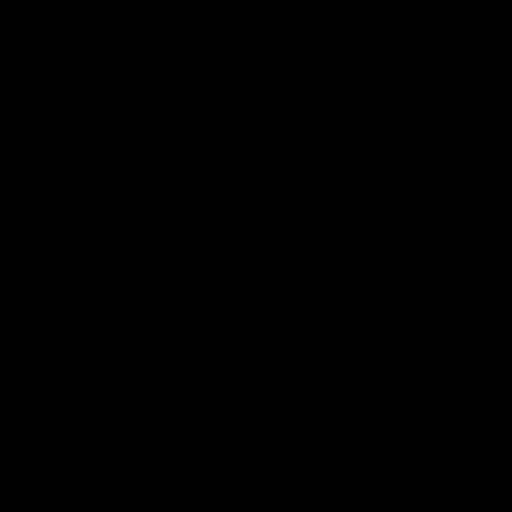
[im 13/44]
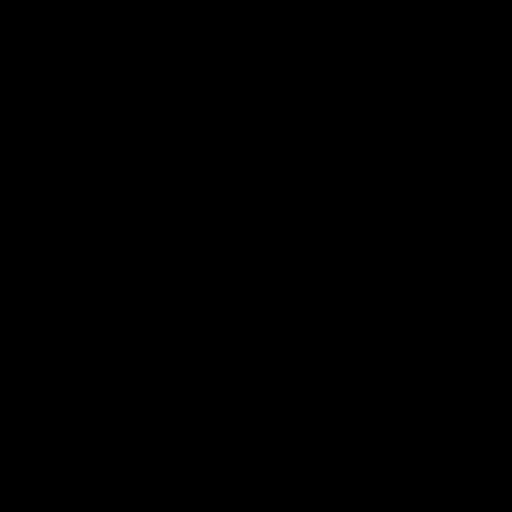
[im 18/44]
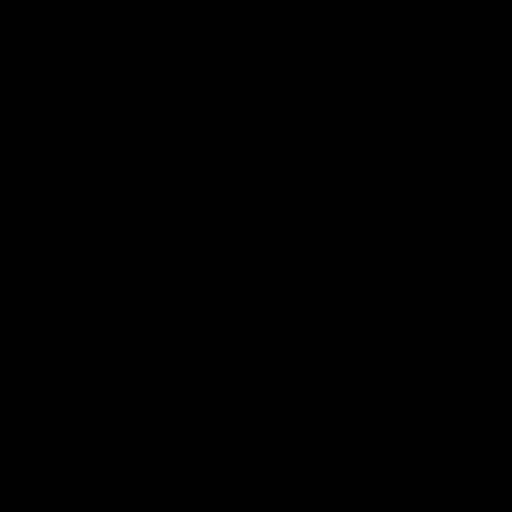
[im 22/44]
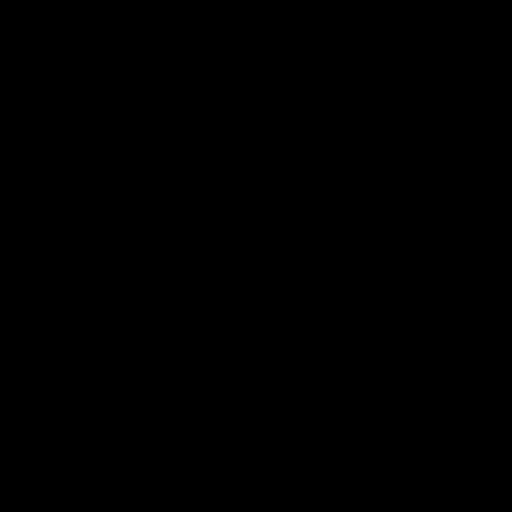
[im 26/44]
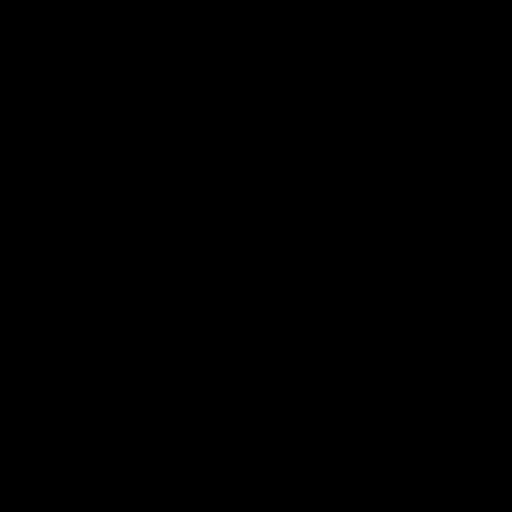
[im 31/44]
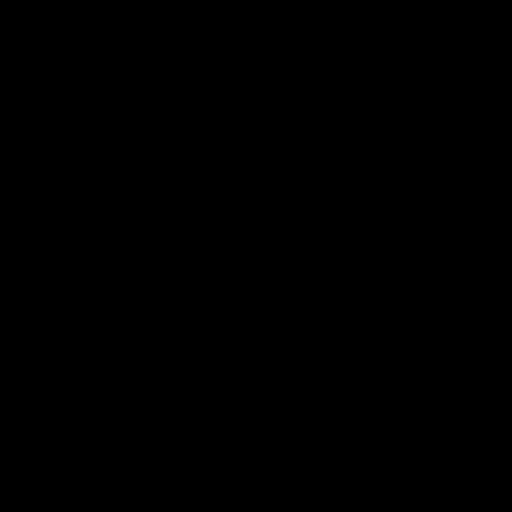
[im 39/44]
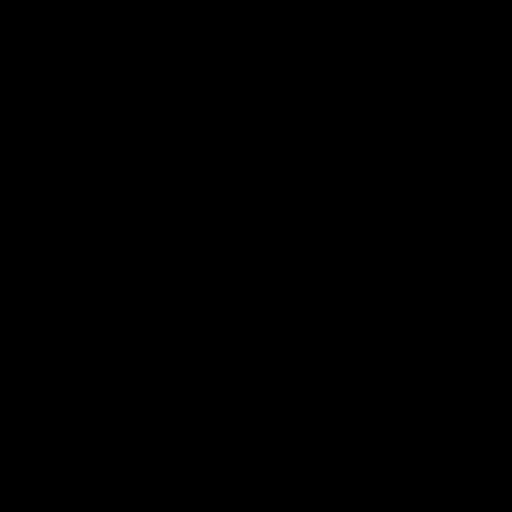

[Series 7001: T1 · axial · 3.0mm · 0.37mm/px · z∈[-11,+96]mm · 3 of 44 slices shown (2 of 2)]
[im 5/44]
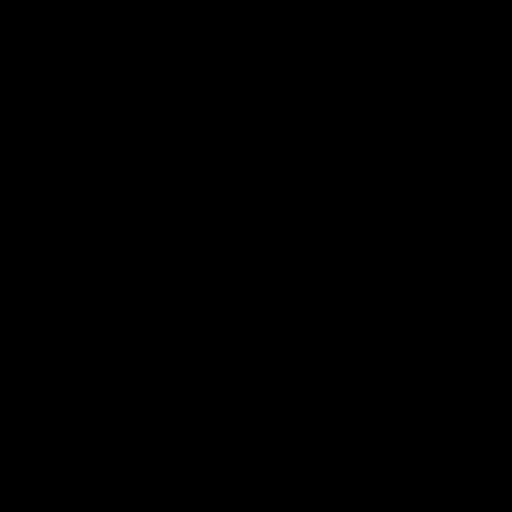
[im 22/44]
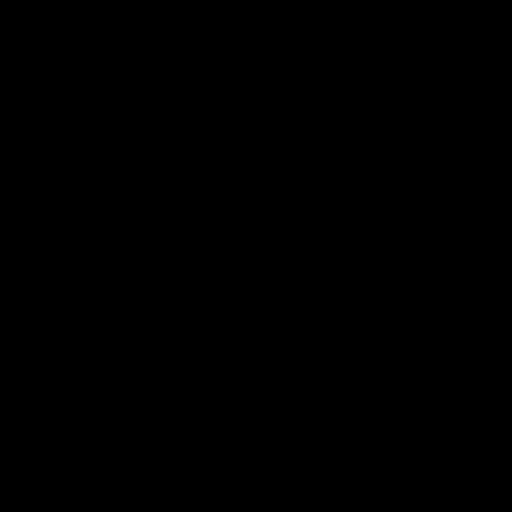
[im 39/44]
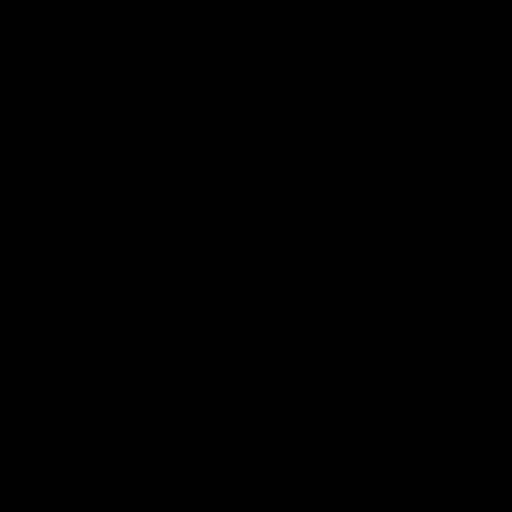

[18 of 48 positions shown; findings below may reference images not displayed]

Comparison made to report of MRI cervical spine August 03, 2019. Currently, direct 
comparison to this exam is not possible.
FINDINGS: Loss of the normal cervical lordosis. 1 mm retrolisthesis C5 on C6. 
Otherwise there is anatomic sagittal alignment. Vertebral body height preserved. 
No fracture. Normal marrow signal for age. No marrow or ligamentous edema. 
Prevertebral soft tissues are normal. Again noted is a focus of abnormal 
hyperintense T2 signal at the craniocervical junction, see image 9 of series 
8334 but without abnormal enhancement. This appears similar to what was 
described on the prior report. See axial image 7 of series 9552. The cervical 
cord becomes thinner with a degree of atrophy noted from the C5 level 
inferiorly. No other convincing abnormal cord signal. No abnormal cord 
enhancement to indicate active demyelination. No abnormal enhancement elsewhere. 
Craniocervical junction: Otherwise normal. 
C2-C3: The disc is normal in height and signal. No disc herniation. Normal 
facets. No spinal canal or neural foraminal stenosis. 
C3-C4: The disc is normal in height and signal. No disc herniation. Normal 
facets. No spinal canal or neural foraminal stenosis. 
C4-C5: The disc is normal in height and signal. No disc herniation. Normal 
facets. No spinal canal or neural foraminal stenosis. 
C5-C6: Mild loss of disc height with marginal osteophytes. Minimal generalized 
disc osteophyte complex. Canal and right foramen are patent. Minimal narrowing 
left neural foramen. Normal facets. 
C6-C7: The disc is normal in height and signal. No disc herniation. Normal 
facets. No spinal canal or neural foraminal stenosis. 
C7-T1: The disc is normal in height and signal. No disc herniation. Normal 
facets. No spinal canal or neural foraminal stenosis.
IMPRESSION: Comparison made to report of prior MR cervical spine August 03, 2019. Currently, 
direct comparison to this exam is not possible. 
There is a focus of abnormal hyperintense T2 signal at the craniocervical 
junction, involving the dorsal cord, but without enhancement to suggest active 
plaque. This appears stable to previously described. No other abnormal cord 
signal. 
Mild cervical degenerative changes detailed above.

## 2021-08-24 IMAGING — MR MRI HEAD W/WO CONTRAST
8 of 11 series · 19 of 48 positions shown · non-contrast
Comparison: MRI cervical spine August 24, 2021.

MRI HEAD W/WO CONTRAST , 08/24/2021 [DATE]: 
CLINICAL INDICATION: Multiple sclerosis.. History of melanoma..
TECHNIQUE: Multiplanar, multiecho position MR images of the brain were performed 
without and with 7 mL of Vueway were injected intravenously by hand.

[Series 3001: T1 · sagittal · 5.0mm · 0.42mm/px · 1 of 25 slices shown]
[im 1/25]
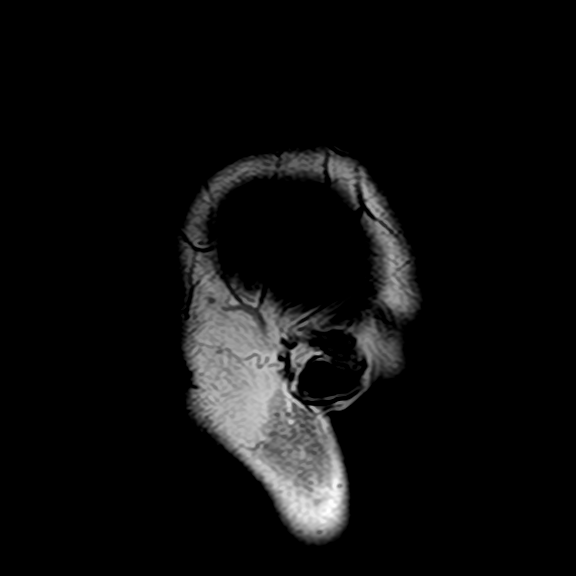

[Series 4001: FLAIR fat-sat · sagittal · 5.0mm · 0.44mm/px · 1 of 25 slices shown (1 of 2)]
[im 1/25]
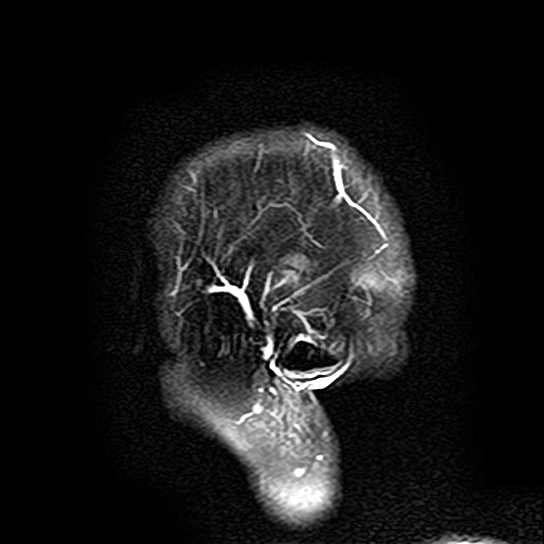

[Series 5001: isodwi · axial · 5.0mm · 0.98mm/px · z∈[-57,+97]mm · 2 of 27 slices shown]
[im 1/27]
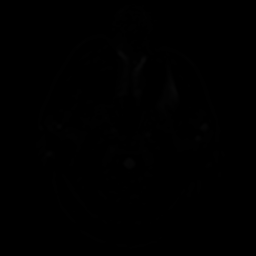
[im 27/27]
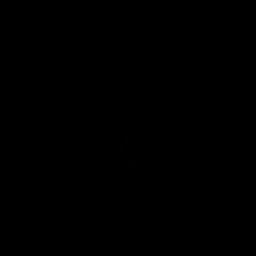

[Series 5002: isoadc · axial · 5.0mm · 0.98mm/px · z∈[-57,+97]mm · 2 of 27 slices shown]
[im 1/27]
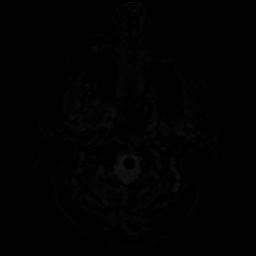
[im 27/27]
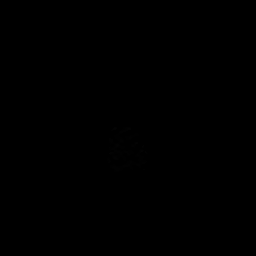

[Series 6001: FLAIR fat-sat · axial · 5.0mm · 0.44mm/px · z∈[-56,+98]mm · 2 of 27 slices shown (2 of 2)]
[im 1/27]
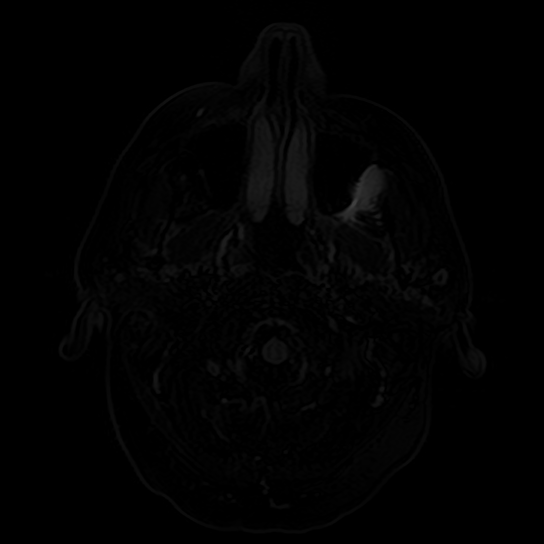
[im 27/27]
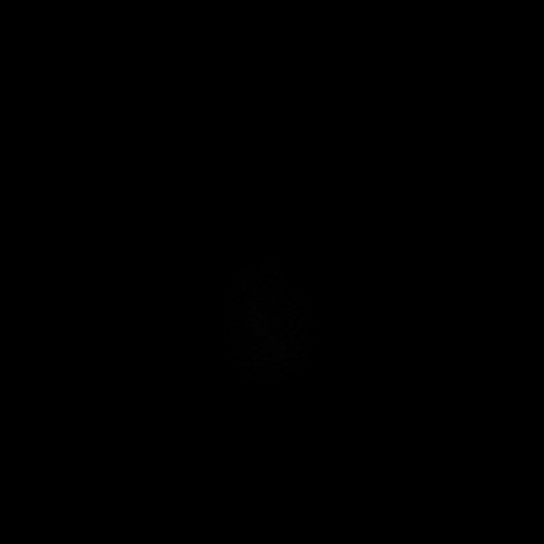

[Series 7001: T2-star · axial · 5.0mm · 0.47mm/px · z∈[-56,+98]mm · 2 of 27 slices shown]
[im 1/27]
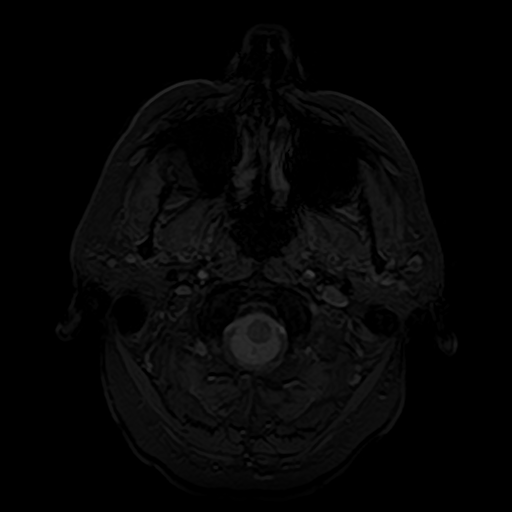
[im 27/27]
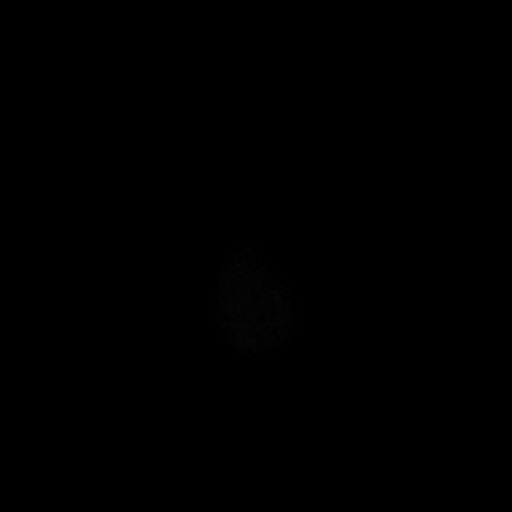

[Series 9001: T2 post-contrast · axial · 2.5mm · 0.42mm/px · z∈[-64,+104]mm · 4 of 69 slices shown]
[im 1/69]
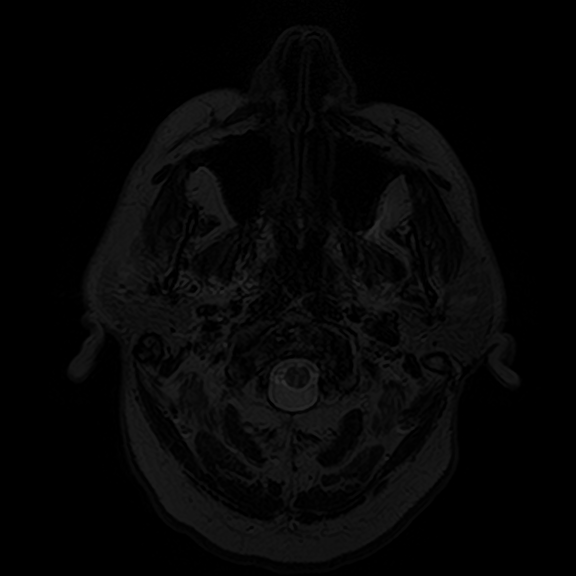
[im 23/69]
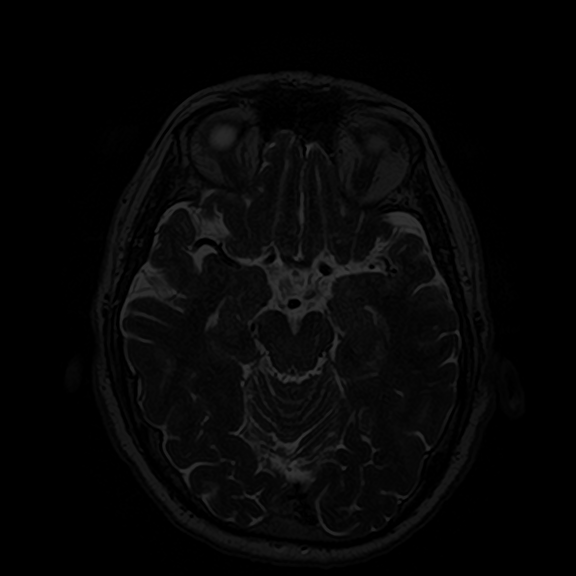
[im 46/69]
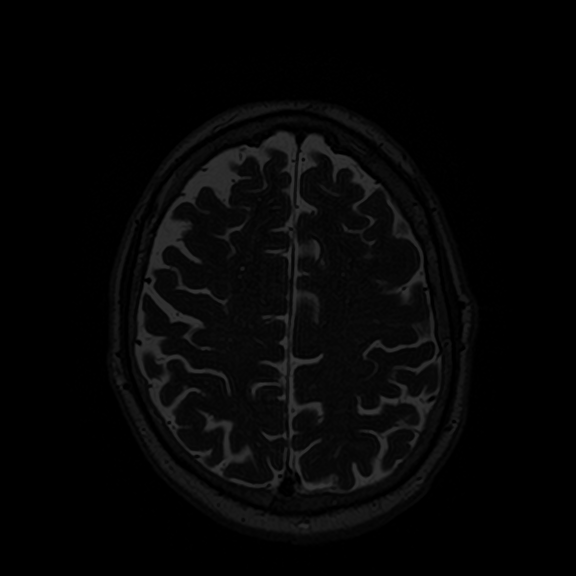
[im 69/69]
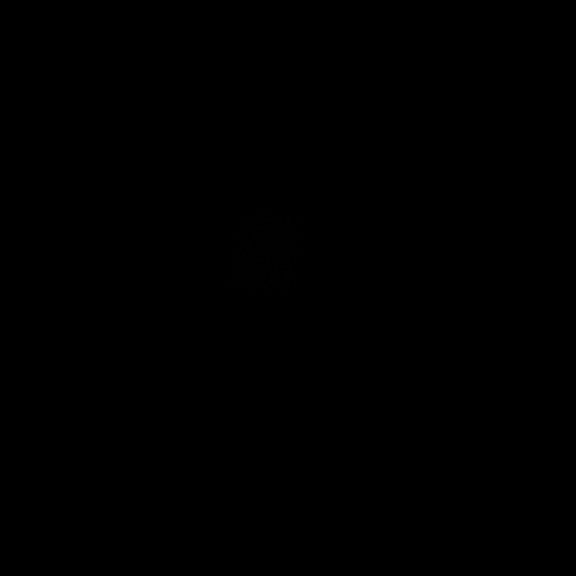

[cor rfmt · coronal · 1.0mm · 0.47mm/px · 5 of 122 slices shown]
[im 1/122]
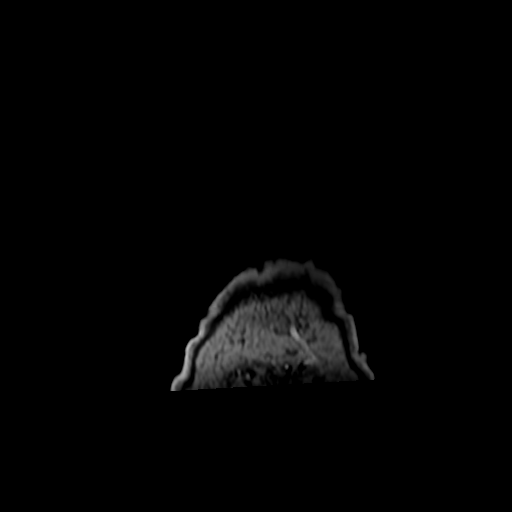
[im 21/122]
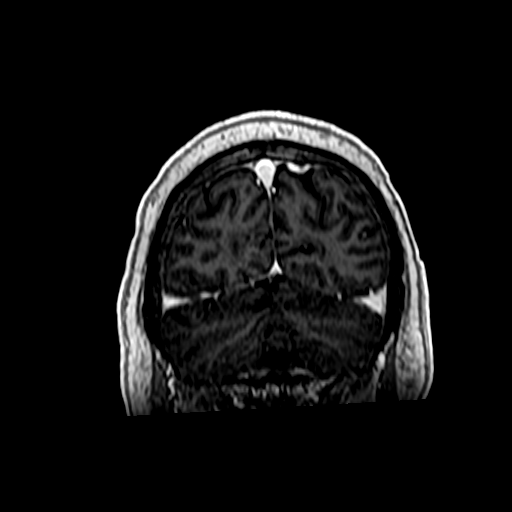
[im 41/122]
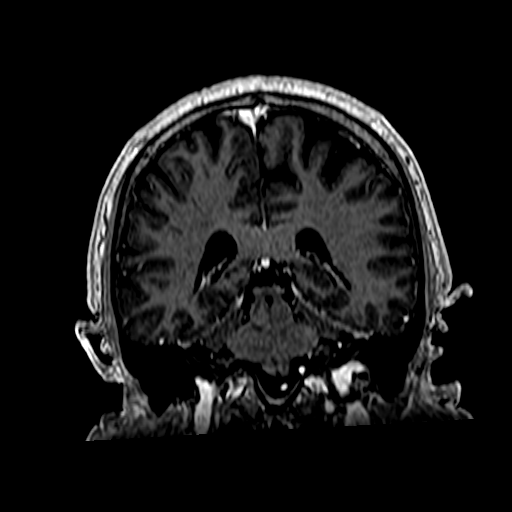
[im 61/122]
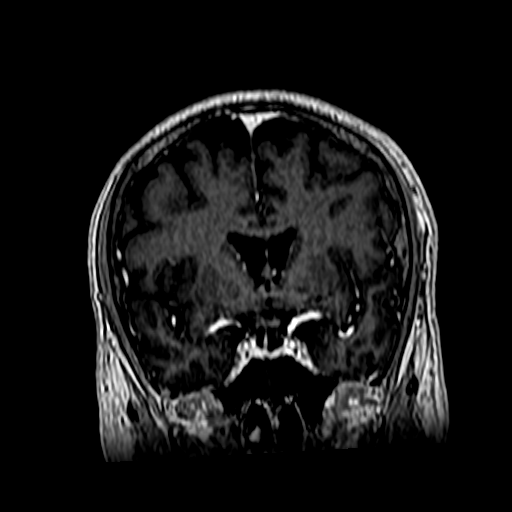
[im 81/122]
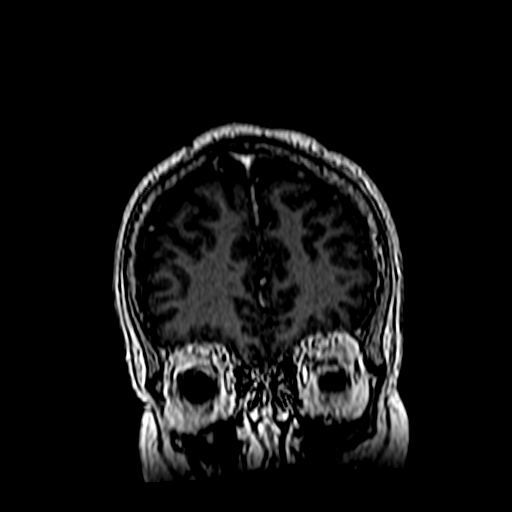

[19 of 48 positions shown; findings below may reference images not displayed]

Comparison made to report of brain 
MRI August 03, 2019. Currently, direct comparison is not possible.
FINDINGS: -------------------------------------------------------------------------------- 
------------------------- 
INTRACRANIAL: 
No acute ischemia. Single focus of susceptibility involving the left occipital 
lobe. This appears stable to prior report. There are scattered punctate 
appearing juxtacortical, deep gray matter, and periventricular T-2 FLAIR 
hyperintensities which are nonenhancing. No abnormal brainstem or infratentorial 
signal abnormality. When compared to report of prior study, the appearance does 
not appear appreciably changed. No abnormal enhancement or restricted diffusion 
to suggest active plaque. Mild generalized brain volume loss without lobar 
specific pattern. T1 hypointense margin is considered low. Corpus callosum 
parenchymal volume is preserved. 
There is a 6 mm focus of T1 hyperintensity which lies adjacent to the left 
tentorial leaflet. This is only present within on the axial pre and post 
sequences and is not evident on other sequences and is most consistent with 
artifact. No convincing evidence of abnormal intracranial enhancement and no 
evidence of intracranial metastatic disease. Patency of the intracranial 
vascular flow voids.  No acute intracranial hemorrhage, mass effect, midline 
shift. No large sellar mass. No hydrocephalus. No pathologic contrast 
enhancement.  
-------------------------------------------------------------------------------- 
----------------------- 
OTHER: 
ORBITS/SINUSES/T-BONES:  Visualized orbits show no acute abnormality or mass.  
Scattered fluid within several left mastoid air cells.  Visualized paranasal 
sinuses are clear. 
MARROW SIGNAL/SOFT TISSUES: No focal suspect signal abnormality.  
-------------------------------------------------------------------------------- 
-------------------
IMPRESSION: Though direct comparison is not possible, there does not appear to be 
significant change in numerous nonenhancing supratentorial T2 FLAIR 
hyperintensities. No acute intracranial process. 
Scattered fluid within several left mastoid air cells.

## 2021-08-24 IMAGING — MR MRI THORACIC SPINE W/WO CONTRAST
4 of 9 series · 15 of 48 positions shown · IV contrast (gadolinium)
Comparison: MRI cervical spine August 24, 2021.

MRI THORACIC SPINE W/WO CONTRAST , 08/24/2021 [DATE]: 
CLINICAL INDICATION: History of melanoma. Multiple sclerosis..
TECHNIQUE: Multiplanar, multiecho position MR images of the thoracic spine were 
performed without and with intravenous gadolinium enhancement.  7 mL of Vueway 
were injected intravenously by hand.

[Series 2001: T2 · sagittal · 4.0mm · 0.55mm/px · 2 of 13 slices shown (1 of 3)]
[im 1/13]
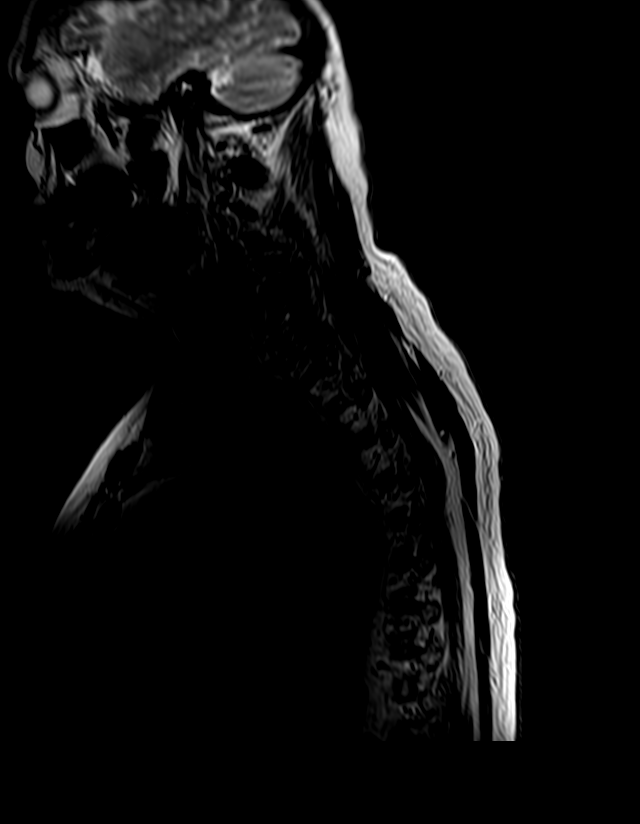
[im 13/13]
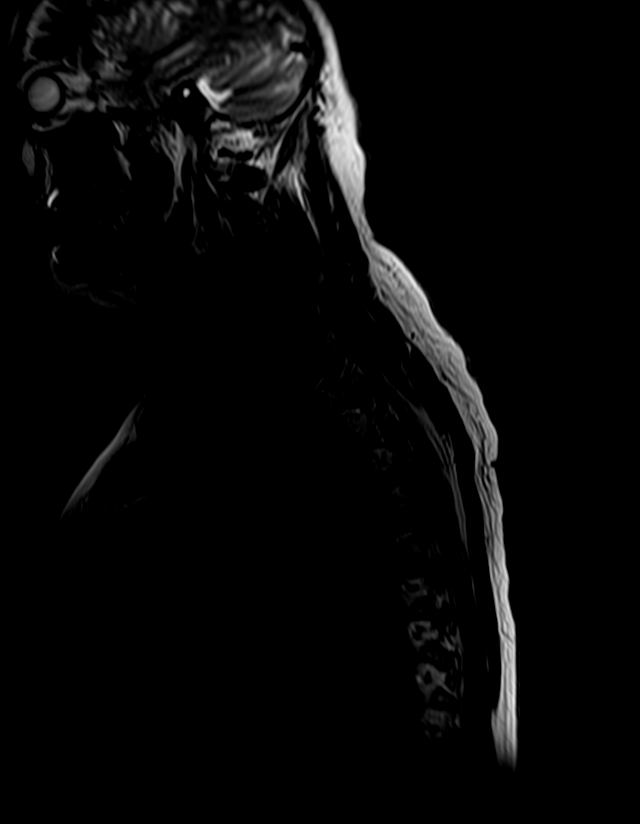

[Series 4001: T2 · sagittal · 4.0mm · 0.48mm/px · 2 of 13 slices shown (2 of 3)]
[im 1/13]
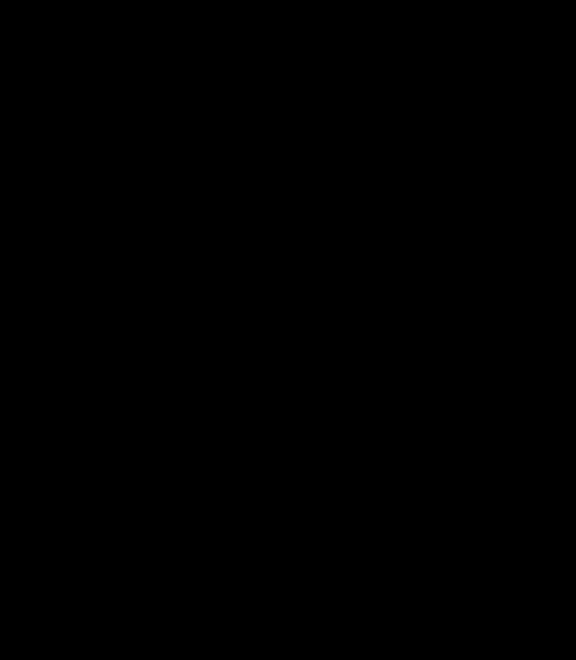
[im 13/13]
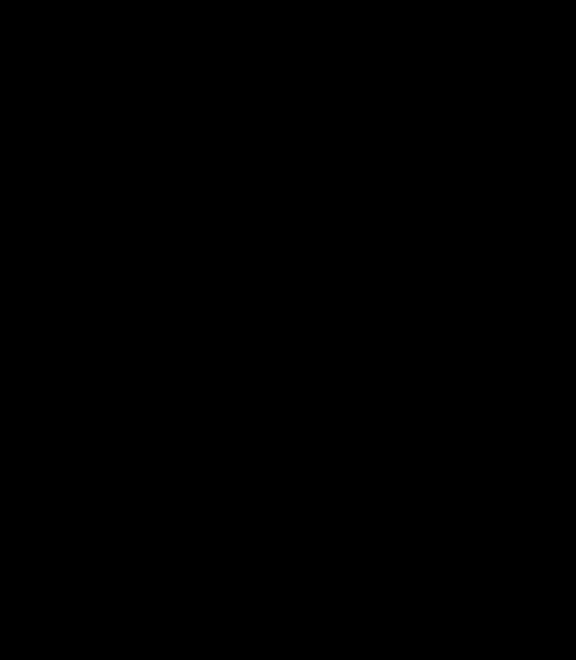

[Series 5001: T1 · sagittal · 4.0mm · 0.48mm/px · 3 of 13 slices shown]
[im 1/13]
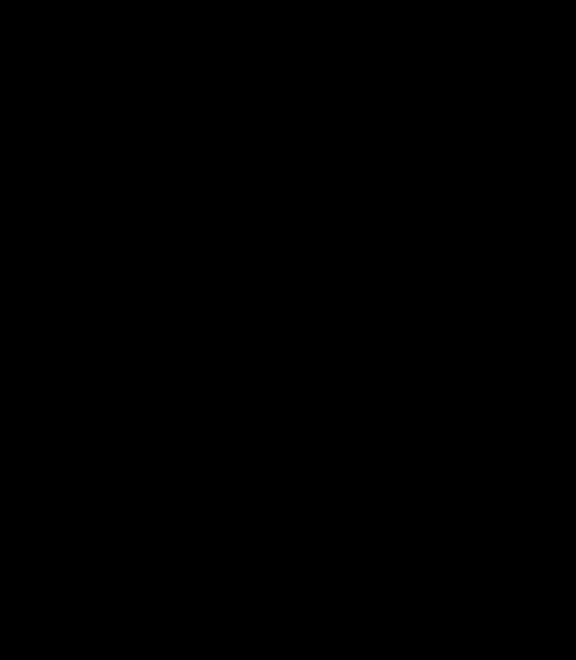
[im 7/13]
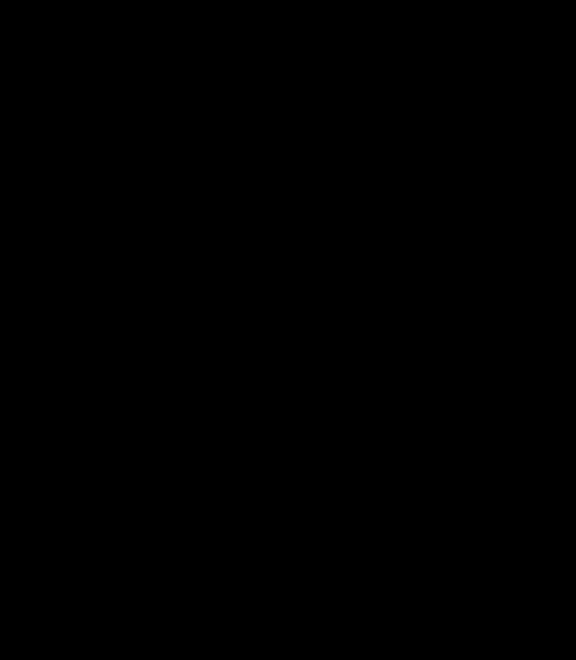
[im 13/13]
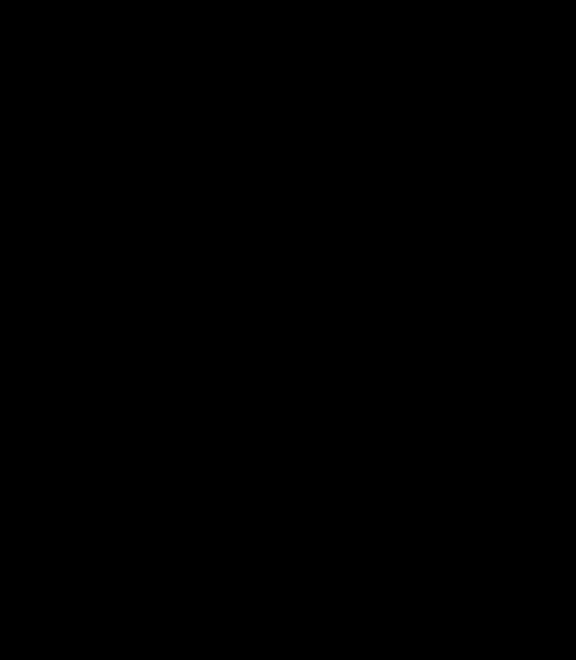

[Series 7001: T2 · axial · 5.0mm · 0.35mm/px · z∈[-291,-62]mm · 8 of 44 slices shown (3 of 3)]
[im 1/44]
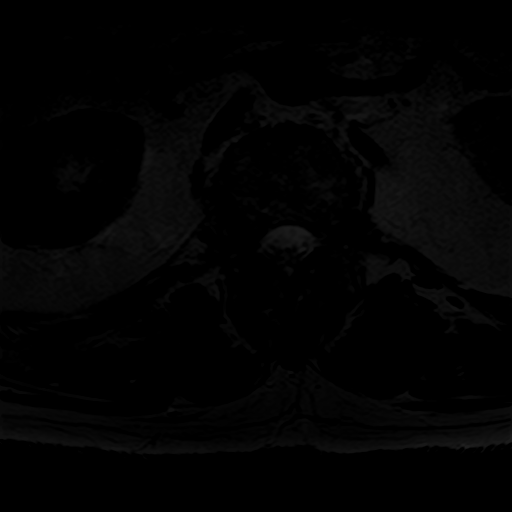
[im 6/44]
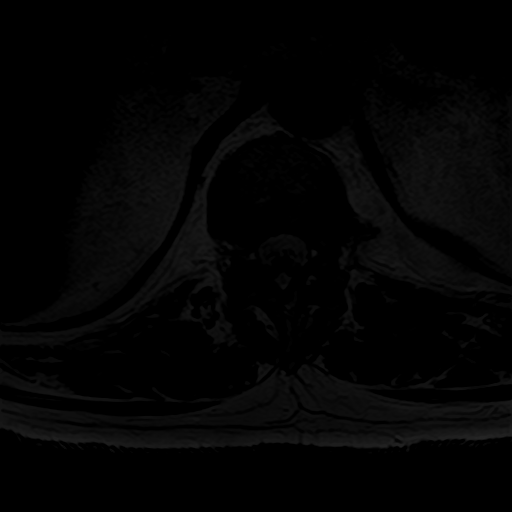
[im 11/44]
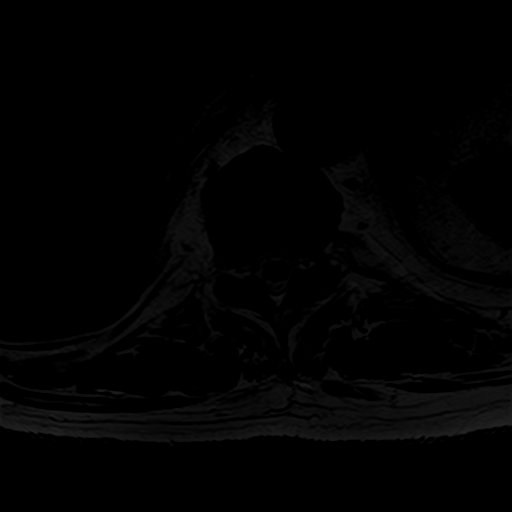
[im 17/44]
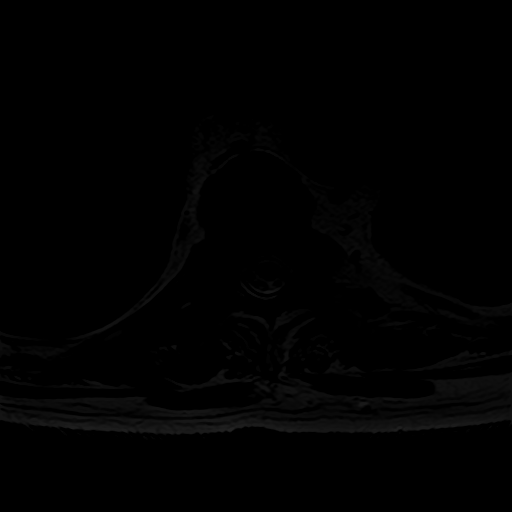
[im 22/44]
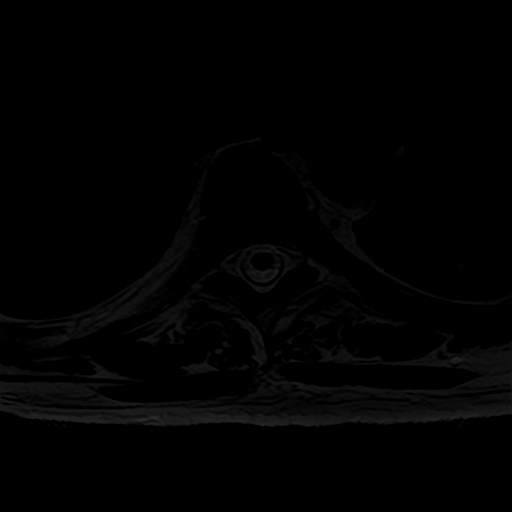
[im 27/44]
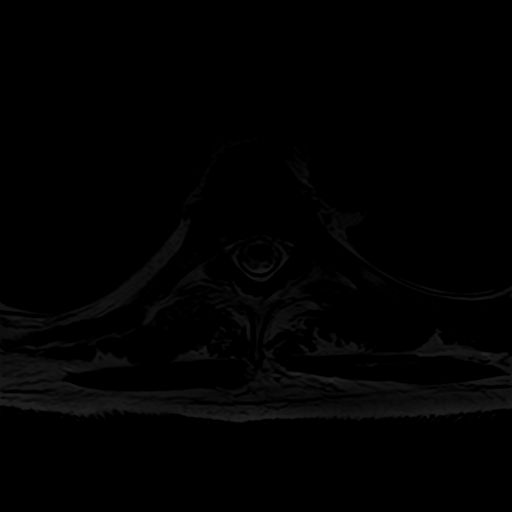
[im 33/44]
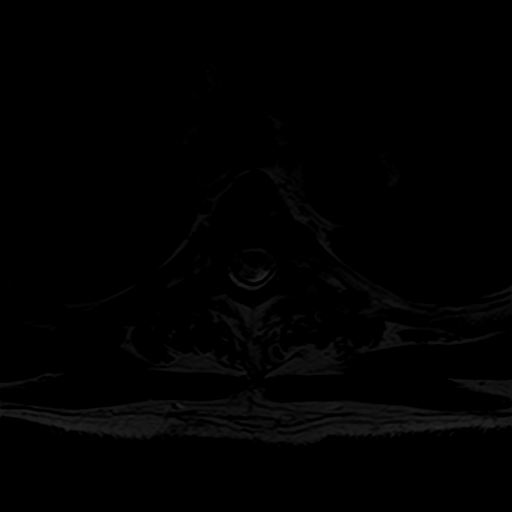
[im 38/44]
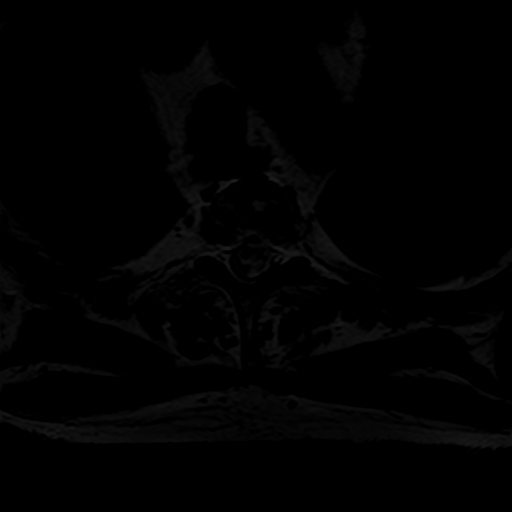

[15 of 48 positions shown; findings below may reference images not displayed]

MRI August 24, 2021. Comparison made to report of MR thoracic spine August 03, 2019. 
Currently, direct comparison to the study is not possible.
FINDINGS: -------------------------------------------------------------------------------- 
------ 
GENERAL: 
ALIGNMENT: Normal. 
VERTEBRAL BODY HEIGHT: Normal.  
MARROW SIGNAL: No focal suspect signal abnormality. 
CORD SIGNAL: There is a stable small focus of nonenhancing abnormal signal 
involving the dorsal cord at T2. Similar small focus of abnormal cord signal 
involving the left posterior cord T3-T4. There is a focus of abnormal cord 
signal involving the dorsal cord at T4-T5 without enhancement. This continues 
inferiorly to the mid T6 level. At T7-T8, there is a small focus of abnormal 
cord signal involving the dorsal cord appears similar to prior report. No 
convincing evidence of abnormal cord signal elsewhere. No abnormal cord 
enhancement. 
ADDITIONAL FINDINGS: None. 
-------------------------------------------------------------------------------- 
------ 
RELEVANT SEGMENTAL (levels with severe stenosis or significant findings): 
No evidence of significant or critical central canal or foraminal stenosis. 
Variable multilevel degrees of loss of disc height, loss of disc signal and 
Schmorls node formation. 
-------------------------------------------------------------------------------- 
------
IMPRESSION: No direct comparison is not currently possible, when compared to report of MR 
thoracic spine August 03, 2019, there are regions of nonenhancing abnormal cord 
signal and do not appear appreciably changed. Details above.
